# Patient Record
Sex: Female | Born: 1990
Health system: Southern US, Community
[De-identification: ages and names within clinical notes are randomized; demographics above are authoritative.]

## PROBLEM LIST (undated history)

## (undated) DIAGNOSIS — O009 Unspecified ectopic pregnancy without intrauterine pregnancy: Secondary | ICD-10-CM

## (undated) DIAGNOSIS — F32A Depression, unspecified: Secondary | ICD-10-CM

## (undated) DIAGNOSIS — E785 Hyperlipidemia, unspecified: Secondary | ICD-10-CM

## (undated) DIAGNOSIS — F329 Major depressive disorder, single episode, unspecified: Secondary | ICD-10-CM

## (undated) DIAGNOSIS — J45909 Unspecified asthma, uncomplicated: Secondary | ICD-10-CM

## (undated) DIAGNOSIS — R569 Unspecified convulsions: Secondary | ICD-10-CM

## (undated) HISTORY — DX: Hyperlipidemia, unspecified: E78.5

## (undated) HISTORY — PX: APPENDECTOMY: SHX54

## (undated) HISTORY — DX: Unspecified convulsions: R56.9

---

## 2012-01-09 DIAGNOSIS — J45909 Unspecified asthma, uncomplicated: Secondary | ICD-10-CM | POA: Diagnosis not present

## 2012-01-09 DIAGNOSIS — Z23 Encounter for immunization: Secondary | ICD-10-CM | POA: Diagnosis not present

## 2012-01-09 DIAGNOSIS — Z331 Pregnant state, incidental: Secondary | ICD-10-CM | POA: Diagnosis not present

## 2012-01-11 DIAGNOSIS — Z833 Family history of diabetes mellitus: Secondary | ICD-10-CM | POA: Diagnosis not present

## 2012-01-11 DIAGNOSIS — O2390 Unspecified genitourinary tract infection in pregnancy, unspecified trimester: Secondary | ICD-10-CM | POA: Diagnosis not present

## 2012-01-11 DIAGNOSIS — F329 Major depressive disorder, single episode, unspecified: Secondary | ICD-10-CM | POA: Diagnosis not present

## 2012-01-11 DIAGNOSIS — N390 Urinary tract infection, site not specified: Secondary | ICD-10-CM | POA: Diagnosis not present

## 2012-01-11 DIAGNOSIS — O200 Threatened abortion: Secondary | ICD-10-CM | POA: Diagnosis not present

## 2012-01-11 DIAGNOSIS — O209 Hemorrhage in early pregnancy, unspecified: Secondary | ICD-10-CM | POA: Diagnosis not present

## 2012-01-11 DIAGNOSIS — F17200 Nicotine dependence, unspecified, uncomplicated: Secondary | ICD-10-CM | POA: Diagnosis not present

## 2012-01-11 DIAGNOSIS — F411 Generalized anxiety disorder: Secondary | ICD-10-CM | POA: Diagnosis not present

## 2012-01-11 DIAGNOSIS — J45909 Unspecified asthma, uncomplicated: Secondary | ICD-10-CM | POA: Diagnosis not present

## 2012-01-13 DIAGNOSIS — O2690 Pregnancy related conditions, unspecified, unspecified trimester: Secondary | ICD-10-CM | POA: Diagnosis not present

## 2012-01-13 DIAGNOSIS — O200 Threatened abortion: Secondary | ICD-10-CM | POA: Diagnosis not present

## 2012-01-13 DIAGNOSIS — N898 Other specified noninflammatory disorders of vagina: Secondary | ICD-10-CM | POA: Diagnosis not present

## 2012-01-15 DIAGNOSIS — O200 Threatened abortion: Secondary | ICD-10-CM | POA: Diagnosis not present

## 2012-01-15 DIAGNOSIS — O009 Unspecified ectopic pregnancy without intrauterine pregnancy: Secondary | ICD-10-CM | POA: Diagnosis not present

## 2012-01-15 DIAGNOSIS — O00109 Unspecified tubal pregnancy without intrauterine pregnancy: Secondary | ICD-10-CM | POA: Diagnosis not present

## 2012-01-19 DIAGNOSIS — O009 Unspecified ectopic pregnancy without intrauterine pregnancy: Secondary | ICD-10-CM | POA: Diagnosis not present

## 2012-01-23 DIAGNOSIS — O00109 Unspecified tubal pregnancy without intrauterine pregnancy: Secondary | ICD-10-CM | POA: Diagnosis not present

## 2012-01-26 DIAGNOSIS — N9489 Other specified conditions associated with female genital organs and menstrual cycle: Secondary | ICD-10-CM | POA: Diagnosis not present

## 2012-01-26 DIAGNOSIS — O009 Unspecified ectopic pregnancy without intrauterine pregnancy: Secondary | ICD-10-CM | POA: Diagnosis not present

## 2012-01-26 DIAGNOSIS — Z1329 Encounter for screening for other suspected endocrine disorder: Secondary | ICD-10-CM | POA: Diagnosis not present

## 2012-02-02 DIAGNOSIS — O009 Unspecified ectopic pregnancy without intrauterine pregnancy: Secondary | ICD-10-CM | POA: Diagnosis not present

## 2012-02-08 DIAGNOSIS — O00109 Unspecified tubal pregnancy without intrauterine pregnancy: Secondary | ICD-10-CM | POA: Diagnosis not present

## 2012-02-08 DIAGNOSIS — Z113 Encounter for screening for infections with a predominantly sexual mode of transmission: Secondary | ICD-10-CM | POA: Diagnosis not present

## 2012-02-08 DIAGNOSIS — Z01419 Encounter for gynecological examination (general) (routine) without abnormal findings: Secondary | ICD-10-CM | POA: Diagnosis not present

## 2012-02-08 DIAGNOSIS — Z9189 Other specified personal risk factors, not elsewhere classified: Secondary | ICD-10-CM | POA: Diagnosis not present

## 2012-02-16 DIAGNOSIS — O00109 Unspecified tubal pregnancy without intrauterine pregnancy: Secondary | ICD-10-CM | POA: Diagnosis not present

## 2012-02-29 DIAGNOSIS — H43399 Other vitreous opacities, unspecified eye: Secondary | ICD-10-CM | POA: Diagnosis not present

## 2012-06-19 DIAGNOSIS — N912 Amenorrhea, unspecified: Secondary | ICD-10-CM | POA: Diagnosis not present

## 2012-06-24 DIAGNOSIS — N912 Amenorrhea, unspecified: Secondary | ICD-10-CM | POA: Diagnosis not present

## 2012-06-25 DIAGNOSIS — O039 Complete or unspecified spontaneous abortion without complication: Secondary | ICD-10-CM | POA: Diagnosis not present

## 2012-06-25 DIAGNOSIS — N898 Other specified noninflammatory disorders of vagina: Secondary | ICD-10-CM | POA: Diagnosis not present

## 2012-06-25 DIAGNOSIS — J45909 Unspecified asthma, uncomplicated: Secondary | ICD-10-CM | POA: Diagnosis not present

## 2012-06-25 DIAGNOSIS — F17200 Nicotine dependence, unspecified, uncomplicated: Secondary | ICD-10-CM | POA: Diagnosis not present

## 2012-07-02 DIAGNOSIS — O200 Threatened abortion: Secondary | ICD-10-CM | POA: Diagnosis not present

## 2013-02-24 DIAGNOSIS — J45902 Unspecified asthma with status asthmaticus: Secondary | ICD-10-CM | POA: Diagnosis not present

## 2014-01-08 ENCOUNTER — Emergency Department (HOSPITAL_COMMUNITY): Payer: Medicare Other

## 2014-01-08 ENCOUNTER — Emergency Department (HOSPITAL_COMMUNITY)
Admission: EM | Admit: 2014-01-08 | Discharge: 2014-01-08 | Disposition: A | Discharge status: Home / Self Care | Payer: Medicare Other | Attending: Emergency Medicine | Admitting: Emergency Medicine

## 2014-01-08 ENCOUNTER — Encounter (HOSPITAL_COMMUNITY): Payer: Self-pay | Admitting: Emergency Medicine

## 2014-01-08 DIAGNOSIS — F3289 Other specified depressive episodes: Secondary | ICD-10-CM | POA: Insufficient documentation

## 2014-01-08 DIAGNOSIS — F17200 Nicotine dependence, unspecified, uncomplicated: Secondary | ICD-10-CM | POA: Diagnosis not present

## 2014-01-08 DIAGNOSIS — IMO0002 Reserved for concepts with insufficient information to code with codable children: Secondary | ICD-10-CM | POA: Diagnosis not present

## 2014-01-08 DIAGNOSIS — J45909 Unspecified asthma, uncomplicated: Secondary | ICD-10-CM | POA: Insufficient documentation

## 2014-01-08 DIAGNOSIS — Z79899 Other long term (current) drug therapy: Secondary | ICD-10-CM | POA: Insufficient documentation

## 2014-01-08 DIAGNOSIS — Z792 Long term (current) use of antibiotics: Secondary | ICD-10-CM | POA: Diagnosis not present

## 2014-01-08 DIAGNOSIS — F329 Major depressive disorder, single episode, unspecified: Secondary | ICD-10-CM | POA: Insufficient documentation

## 2014-01-08 DIAGNOSIS — R509 Fever, unspecified: Secondary | ICD-10-CM | POA: Insufficient documentation

## 2014-01-08 DIAGNOSIS — Z3202 Encounter for pregnancy test, result negative: Secondary | ICD-10-CM | POA: Insufficient documentation

## 2014-01-08 DIAGNOSIS — N390 Urinary tract infection, site not specified: Secondary | ICD-10-CM | POA: Diagnosis not present

## 2014-01-08 DIAGNOSIS — R109 Unspecified abdominal pain: Secondary | ICD-10-CM | POA: Diagnosis not present

## 2014-01-08 HISTORY — DX: Unspecified asthma, uncomplicated: J45.909

## 2014-01-08 HISTORY — DX: Major depressive disorder, single episode, unspecified: F32.9

## 2014-01-08 HISTORY — DX: Unspecified ectopic pregnancy without intrauterine pregnancy: O00.90

## 2014-01-08 HISTORY — DX: Depression, unspecified: F32.A

## 2014-01-08 LAB — URINALYSIS, ROUTINE W REFLEX MICROSCOPIC
Bilirubin Urine: NEGATIVE
Glucose, UA: NEGATIVE mg/dL
KETONES UR: NEGATIVE mg/dL
NITRITE: POSITIVE — AB
PROTEIN: NEGATIVE mg/dL
Specific Gravity, Urine: 1.015 (ref 1.005–1.030)
Urobilinogen, UA: 0.2 mg/dL (ref 0.0–1.0)
pH: 7 (ref 5.0–8.0)

## 2014-01-08 LAB — URINE MICROSCOPIC-ADD ON

## 2014-01-08 LAB — PREGNANCY, URINE: Preg Test, Ur: NEGATIVE

## 2014-01-08 MED ORDER — CEPHALEXIN 500 MG PO CAPS: 500.0000 mg | ORAL_CAPSULE | Freq: Four times a day (QID) | ORAL | Status: DC

## 2014-01-08 NOTE — ED Provider Notes (Signed)
CSN: [id number]     Arrival date & time 01/08/14  [id number] History   First MD Initiated Contact with Patient 01/08/14 0940     Chief Complaint  Patient presents with  . Abdominal Pain  . Flank Pain    HPI Pt was seen at 0945. Per pt, c/o sudden onset and persistence of constant left sided low back "pain" that began 3 to 4 days ago.  Pt describes the pain as "sharp," "shooting," and occasionally radiating into the left side of her abd.  Has been associated with home temps to "100." Denies any other symptoms. Denies vaginal bleeding/discharge, no dysuria/hematuria, no abd pain, no N/V/D, no black or blood in stools, no CP/SOB.    Past Medical History  Diagnosis Date  . Asthma   . Depression   . Ectopic pregnancy    Past Surgical History  Procedure Laterality Date  . Appendectomy      History  Substance Use Topics  . Smoking status: Current Every Day Smoker    Types: Cigarettes  . Smokeless tobacco: Not on file  . Alcohol Use: No    Review of Systems ROS: Statement: All systems negative except as marked or noted in the HPI; Constitutional: +chills. ; ; Eyes: Negative for eye pain, redness and discharge. ; ; ENMT: Negative for ear pain, hoarseness, nasal congestion, sinus pressure and sore throat. ; ; Cardiovascular: Negative for chest pain, palpitations, diaphoresis, dyspnea and peripheral edema. ; ; Respiratory: Negative for cough, wheezing and stridor. ; ; Gastrointestinal: Negative for nausea, vomiting, diarrhea, abdominal pain, blood in stool, hematemesis, jaundice and rectal bleeding. . ; ; Genitourinary: Negative for dysuria, flank pain and hematuria. ; ; GYN:  No vaginal bleeding, no vaginal discharge, no vulvar pain.;; Musculoskeletal: +low back pain. Negative for neck pain. Negative for swelling and trauma.; ; Skin: Negative for pruritus, rash, abrasions, blisters, bruising and skin lesion.; ; Neuro: Negative for headache, lightheadedness and neck stiffness. Negative for weakness,  altered level of consciousness , altered mental status, extremity weakness, paresthesias, involuntary movement, seizure and syncope.       Allergies  Codeine  Home Medications   Current Outpatient Rx  Name  Route  Sig  Dispense  Refill  . albuterol (PROVENTIL HFA;VENTOLIN HFA) 108 (90 BASE) MCG/ACT inhaler   Inhalation   Inhale 2 puffs into the lungs every 6 (six) hours as needed for wheezing or shortness of breath.         Marland Kitchen. buPROPion (WELLBUTRIN SR) 150 MG 12 hr tablet   Oral   Take 150 mg by mouth 2 (two) times daily.         Marland Kitchen. escitalopram (LEXAPRO) 20 MG tablet   Oral   Take 20 mg by mouth daily.         . Fluticasone-Salmeterol (ADVAIR) 250-50 MCG/DOSE AEPB   Inhalation   Inhale 1 puff into the lungs 2 (two) times daily.         Marland Kitchen. OVER THE COUNTER MEDICATION   Both Eyes   Place 2 drops into both eyes daily as needed (burning).         . cephALEXin (KEFLEX) 500 MG capsule   Oral   Take 1 capsule (500 mg total) by mouth 4 (four) times daily.   40 capsule   0    BP 108/71  Pulse 106  Temp(Src) 98.4 F (36.9 C) (Oral)  Resp 16  Ht 5' (1.524 m)  Wt 70 lb (31.752 kg)  BMI 13.67 kg/m2  SpO2 100%  LMP 01/06/2014 Physical Exam 0950: Physical examination:  Nursing notes reviewed; Vital signs and O2 SAT reviewed;  Constitutional: Well developed, Well nourished, Well hydrated, In no acute distress; Head:  Normocephalic, atraumatic; Eyes: EOMI, PERRL, No scleral icterus; ENMT: Mouth and pharynx normal, Mucous membranes moist; Neck: Supple, Full range of motion, No lymphadenopathy; Cardiovascular: Regular rate and rhythm, No murmur, rub, or gallop; Respiratory: Breath sounds clear & equal bilaterally, No rales, rhonchi, wheezes.  Speaking full sentences with ease, Normal respiratory effort/excursion; Chest: Nontender, Movement normal; Abdomen: Soft, Nontender, Nondistended, Normal bowel sounds; Genitourinary: No CVA tenderness; Spine:  No midline CS, TS, LS  tenderness. +mild TTP left lumbar paraspinal muscles;;  Extremities: Pulses normal, No tenderness, No edema, No calf edema or asymmetry.; Neuro: AA&Ox3, Major CN grossly intact.  Speech clear. No gross focal motor or sensory deficits in extremities. Climbs on and off stretcher easily by herself. Gait steady.; Skin: Color normal, Warm, Dry.   ED Course  Procedures   EKG Interpretation   None       MDM  MDM Reviewed: previous chart, nursing note and vitals Reviewed previous: labs Interpretation: labs and CT scan     Results for orders placed during the hospital encounter of 01/08/14  URINALYSIS, ROUTINE W REFLEX MICROSCOPIC      Result Value Range   Color, Urine YELLOW  YELLOW   APPearance CLEAR  CLEAR   Specific Gravity, Urine 1.015  1.005 - 1.030   pH 7.0  5.0 - 8.0   Glucose, UA NEGATIVE  NEGATIVE mg/dL   Hgb urine dipstick SMALL (*) NEGATIVE   Bilirubin Urine NEGATIVE  NEGATIVE   Ketones, ur NEGATIVE  NEGATIVE mg/dL   Protein, ur NEGATIVE  NEGATIVE mg/dL   Urobilinogen, UA 0.2  0.0 - 1.0 mg/dL   Nitrite POSITIVE (*) NEGATIVE   Leukocytes, UA SMALL (*) NEGATIVE  PREGNANCY, URINE      Result Value Range   Preg Test, Ur NEGATIVE  NEGATIVE  URINE MICROSCOPIC-ADD ON      Result Value Range   Squamous Epithelial / LPF MANY (*) RARE   WBC, UA TOO NUMEROUS TO COUNT  <3 WBC/hpf   RBC / HPF TOO NUMEROUS TO COUNT  <3 RBC/hpf   Bacteria, UA MANY (*) RARE   Ct Abdomen Pelvis Wo Contrast 01/08/2014   CLINICAL DATA:  Abdominal pain.  Flank pain.  Left-sided pain.  EXAM: CT ABDOMEN AND PELVIS WITHOUT CONTRAST  TECHNIQUE: Multidetector CT imaging of the abdomen and pelvis was performed following the standard protocol without intravenous contrast.  COMPARISON:  None.  FINDINGS: Minimal atelectasis is present at the left base. The lungs are otherwise clear. The heart size is normal. No significant pleural or pericardial effusion is present. The liver and spleen are within normal limits.   The stomach, duodenum, and pancreas are within normal limits. The common bile duct and gallbladder are normal. The adrenal glands are normal bilaterally. The kidneys and ureters are within normal limits. The urinary bladder is unremarkable.  The rectus sigmoid colon is within normal limits. The remainder the colon is unremarkable. The appendix is not discretely visualized and may be surgically absent. There are no secondary signs inflammation. The small bowel is within normal limits. No significant adenopathy or free fluid is present.  The bone windows are unremarkable.  IMPRESSION: No acute abnormality or focal lesion to explain the patient's symptoms.   Electronically Signed   By: Gennette Pac M.D.   On: 01/08/2014 12:06  1215:  Has tol PO well while in the ED without N/V. Abd remains benign, VSS. Pt wants to go home now. +UTI, UC pending. Will tx with keflex. Dx and testing d/w pt.  Questions answered.  Verb understanding, agreeable to d/c home with outpt f/u.   Laray Anger, DO 01/11/14 1150

## 2014-01-08 NOTE — ED Notes (Signed)
Pt. Given ginger ale to drink. Awaiting discharge.

## 2014-01-08 NOTE — ED Notes (Signed)
Pt. Given detailed discharge instructions. 1 script given, verbalized understanding of all.

## 2014-01-08 NOTE — ED Notes (Addendum)
Pt states constant, sharp, pain to left lower back. Intermittent stabbing pain to LLQ of abdomen. Denies N/V/D. Also states headache and temp off 100.0. NAD at this time.

## 2014-01-08 NOTE — Discharge Instructions (Signed)
°Emergency Department Resource Guide °1) Find a Doctor and Pay Out of Pocket °Although you won't have to find out who is covered by your insurance plan, it is a good idea to ask around and get recommendations. You will then need to call the office and see if the doctor you have chosen will accept you as a new patient and what types of options they offer for patients who are self-pay. Some doctors offer discounts or will set up payment plans for their patients who do not have insurance, but you will need to ask so you aren't surprised when you get to your appointment. ° °2) Contact Your Local Health Department °Not all health departments have doctors that can see patients for sick visits, but many do, so it is worth a call to see if yours does. If you don't know where your local health department is, you can check in your phone book. The CDC also has a tool to help you locate your state's health department, and many state websites also have listings of all of their local health departments. ° °3) Find a Walk-in Clinic °If your illness is not likely to be very severe or complicated, you may want to try a walk in clinic. These are popping up all over the country in pharmacies, drugstores, and shopping centers. They're usually staffed by nurse practitioners or physician assistants that have been trained to treat common illnesses and complaints. They're usually fairly quick and inexpensive. However, if you have serious medical issues or chronic medical problems, these are probably not your best option. ° °No Primary Care Doctor: °- Call Health Connect at  832-8000 - they can help you locate a primary care doctor that  accepts your insurance, provides certain services, etc. °- Physician Referral Service- 1-800-533-3463 ° °Chronic Pain Problems: °Organization         Address  Phone   Notes  °[facility]  ([phone]) [phone] Patients need to be referred by their primary care doctor.  ° °Medication  Assistance: °Organization         Address  Phone   Notes  °[place] [facility] [address] °[place], [place] [address] (336) 641-8030 --Must be a resident of [place] °-- Must have NO insurance coverage whatsoever (no Medicaid/ Medicare, etc.) °-- The pt. MUST have a primary care doctor that directs their care regularly and follows them in the community °  °[facility]  (866) 331-1348   °[employer]  (888) 892-1162   ° °Agencies that provide inexpensive medical care: °Organization         Address  Phone   Notes  °[facility]  (336) 832-8035   °[facility]    (336) 832-7272   °[facility] [address] °[place], [place] [address] (336) 832-4777   °[facility] [address], °[place] (336) 271-4999   °[facility]    (336) 373-0678   °[facility]    (336) 272-1050   °[facility] ° [address], [place] Phone:  (336) 832-4444, Fax:  (336) 832-4440 Hours of Operation:  9 am - 6 pm, M-F.  Also accepts Medicaid/Medicare and self-pay.  °[facility] ° [address], [place] Phone: (336) [phone], Fax: (336) [phone]. Hours of Operation:  8:30 am - 5:30 pm, M-F.  Also accepts Medicaid and self-pay.  °[facility] [place] [address], [place] Phone: (336) 878-6027   °[facility] [address], [place], [place] (336)723-1848, Ext. 123 Mondays & Thursdays: 7-9 AM.  First 15 patients are seen on a first come, first serve basis. °  ° °Medicaid-accepting [place] Providers: ° °Organization         Address  Phone   Notes  °[facility] [address], [place] (336) 641-2100 Also accepts self-pay patients.  °[facility] [address], [place] ° (336) 856-9996   °[facility] [address], [place]  (336) 288-8857   °[facility] [address] [place] [address], [place] (336) 299-7000   °Veita Bland [address], [place]  ° (336) 373-1557 Only accepts [place] Access Medicaid patients after they have their name applied to their card.  ° °Self-Pay (no insurance) in [place]: ° °Organization         Address  Phone   Notes  °Sickle Cell Patients, [facility] [address], [place] (336) 832-1970   °[facility] [address], [place] (336) 832-4400   °[facility] ° [address], [place] (336) 992-4800   °[facility]/Dr. Osei-Bonsu ° [address] [place] [address], [place] or [address], [place] (336) 841-8500 Phone number for both [place] and [place] locations is the same.  °[facility] [address], [place] (336) 299-0000   °[facility] [address] [place] [address], [place] or [address] (336) 852-7530 °(336) 878-2260   °[facility] [address], [place] (336) 350-1642, phone; (336) 294-5005, fax Sees patients 1st and 3rd Saturday of every month.  Must not qualify for public or private insurance (i.e. Medicaid, Medicare, [place] Health Choice, Veterans' Benefits) • Household income should be no more than 200% of the poverty level •The clinic cannot treat you if you are pregnant or think you are pregnant • Sexually transmitted diseases are not treated at the clinic.  ° ° °Dental Care: °Organization         Address  Phone  Notes  °[place] [facility] [address], [place] (336) 641-6152 Accepts children up to age 21 who are enrolled in Medicaid or [place] Health Choice; pregnant women with a Medicaid card; and children who have applied for Medicaid or [place] Health Choice, but were declined, whose parents can pay a reduced fee at time of service.  °[place]  [facility] [place]  [address], [place] (336) 641-7733 Accepts children up to age 21 who are enrolled in Medicaid or [place] Health Choice; pregnant women with a Medicaid card; and children who have applied for Medicaid or [place] Health Choice, but were declined, whose parents can pay a reduced fee at time of service.  °[facility] ° [address], [place] (336) 641-4533 Patients are seen by appointment only. Walk-ins are not accepted. [facility] will see patients 18 years of age and older. °Monday - Tuesday (8am-5pm) °Most Wednesdays (8:30-5pm) °$30 per visit, cash only  °[facility] ° [address], [place] (336) 641-4533 Patients are seen by appointment only. Walk-ins are not accepted. [facility] will see patients 18 years of age and older. °One   Wednesday Evening (Monthly: Volunteer Based).  $30 per visit, cash only  °[facility]  (919) 537-3737 for adults; Children under age 4, call Graduate Pediatric Dentistry at ([phone]) [phone]. Children aged 4-14, please call ([phone]) [phone] to request a pediatric application. ° Dental services are provided in all areas of dental care including fillings, crowns and bridges, complete and partial dentures, implants, gum treatment, root canals, and extractions. Preventive care is also provided. Treatment is provided to both adults and children. °Patients are selected via a lottery and there is often a waiting list. °  °[facility] [address], °[place] ° (336) 763-8833 [email or web address] °  °[facility] [address], [place], [place] (336)723-1848, Ext. 123 Second and Fourth Thursday of each month, opens at 6:30 AM; Clinic ends at 9 AM.  Patients are seen on a first-come first-served basis, and a limited number are seen during each clinic.  ° °[facility] ° [address], [place], [place] (336) 723-7904    Eligibility Requirements °You must have lived in [place], [place], or [place] counties for at least the last three months. °  You cannot be eligible for state or federal sponsored healthcare insurance, including Veterans Administration, Medicaid, or Medicare. °  You generally cannot be eligible for healthcare insurance through your employer.  °  How to apply: °Eligibility screenings are held every Tuesday and Wednesday afternoon from 1:00 pm until 4:00 pm. You do not need an appointment for the interview!  °[facility] [address], [place], [place] 336-631-2330   °[facility]  336-342-8273   °[facility]  336-703-3100   °[facility]  336-570-6415   ° °Behavioral Health Resources in the Community: °Intensive Outpatient Programs °Organization         Address  Phone  Notes  °[facility] [address], [place], [place] 336-878-6098   °[facility] [address], [place], [place] 336-832-9800   °[facility] [address], [place], [place] ° 336-882-2125   °[facility] [address],  °[place], [place] 1-800-853-5163 or 336-641-4981   °Substance Abuse Resources °Organization         Address  Phone  Notes  °[facility]  336-882-2125   °[facility]  336-784-9470   °[facility]  336-285-9073   °[facility]  336-845-3988   °Residential & Outpatient Substance Abuse Program  1-800-659-3381   °Psychological Services °Organization         Address  Phone  Notes  °[facility]  336- 832-9600   °[facility]  336- 378-7881   °[facility] [address], [place] 1-800-853-5163 or 336-641-4981   ° °Mobile Crisis Teams °Organization         Address  Phone  Notes  °[facility], Mobile Crisis Care Unit  1-877-626-1772   °Assertive °[facility] ° [address]  [place], [place] 336-834-9664   °Sharon DeEsch [address] °[place] 336-554-5454   ° °Self-Help/Support Groups °Organization         Address  Phone             Notes  °[facility] of  - variety of support groups  336- 373-1402 Call for more information  °Narcotics Anonymous (NA), [facility] [address], °[place]  2 meetings at this location  ° °  Residential Treatment Programs Organization         Address  Phone  Notes  [facility] [address],    [place]  1-610-960-45401-(250)349-4261   [facility]  [address], [place] [phone], [place], [place] [phone]   [facility] [address], [place] [phone] Admissions: 8am-3pm M-F  [facility] [address]    [place], [place] 295-284-1324([phone]   [facility] [address], [place], [place] [phone]   [facility] [address]  [place], [place] [phone]) [phone]   [facility] Intensive Outpatient [address], [place], [place] [phone]   [facility] [address]  [place], [place] [phone] or [phone]   [facility] ([facility]) [address] [place], [place] [phone] Accepts Medicaid  [facility] [address]  [place] [phone]) [phone] Substance Abuse/Addiction Treatment   [facility] Resources Organization         Address  Phone  Notes  [facility]  506-798-0214(888) 425-240-6435   Angie FavaJulie Brannon, PhD [address], [address] [place], [place]   [phone](336) [phone] or [phone](336) ([phone]   [facility]   [address] [place], [place] [phone]) [phone]   [facility] [address], [place], [place] [phone](336) [phone] Insurance/Medicaid/sponsorship through [facility]  [facility] [address]                                    [place], [place] ([phone](336) [phone] Therapy/tele-psych/case    [facility] [address]   [place], [place] [phone]) [phone]    Dr. Lolly MustacheArfeen  [phone]) [phone]   [facility] [place]  [employer] [facility] 1) [address], [place] 2) [address], [place] 3)  [address], [place] [phone](336) ([phone](336) [phone]  [phone]   [facility] Child Abuse Hotline 778-139-4579(336) 660-282-9954 or 5481269925(336) [phone] (After Hours)       Take the prescription as directed. Take over the counter tylenol and ibuprofen, as directed on packaging, as needed for discomfort. Apply moist heat or ice to the area(s) of discomfort, for 15 minutes at a time, several times per day for the next few days.  Do not fall asleep on a heating or ice pack.  Call your regular medical doctor today to schedule a follow up appointment within the next 2 days.  Return to the Emergency Department immediately if worsening.

## 2014-01-10 LAB — URINE CULTURE

## 2014-01-11 ENCOUNTER — Telehealth (HOSPITAL_COMMUNITY): Payer: Self-pay | Admitting: Emergency Medicine

## 2014-01-11 NOTE — ED Notes (Signed)
Post ED Visit - Positive Culture Follow-up  Culture report reviewed by antimicrobial stewardship pharmacist: []  Wes Dulaney, Pharm.D., BCPS [x]  Celedonio MiyamotoJeremy Frens, Pharm.D., BCPS []  Georgina PillionElizabeth Martin, Pharm.D., BCPS []  NutriosoMinh Pham, [address]Ph[address].D., BCPS, AAHIVP []  Estella HuskMichelle Turner, Pharm.D., BCPS, AAHIVP  Positive urine culture Treated with Keflex, organism sensitive to the same and no further patient follow-up is required at this time.  Zeb ComfortHolland, Dynver [place] 01/11/2014, 10:39 AM

## 2014-03-24 DIAGNOSIS — F320 Major depressive disorder, single episode, mild: Secondary | ICD-10-CM | POA: Diagnosis not present

## 2014-03-24 DIAGNOSIS — R300 Dysuria: Secondary | ICD-10-CM | POA: Diagnosis not present

## 2014-03-24 DIAGNOSIS — J45902 Unspecified asthma with status asthmaticus: Secondary | ICD-10-CM | POA: Diagnosis not present

## 2014-03-24 DIAGNOSIS — F411 Generalized anxiety disorder: Secondary | ICD-10-CM | POA: Diagnosis not present

## 2014-03-24 DIAGNOSIS — E669 Obesity, unspecified: Secondary | ICD-10-CM | POA: Diagnosis not present

## 2014-03-24 DIAGNOSIS — F17200 Nicotine dependence, unspecified, uncomplicated: Secondary | ICD-10-CM | POA: Diagnosis not present

## 2014-06-25 DIAGNOSIS — F411 Generalized anxiety disorder: Secondary | ICD-10-CM | POA: Diagnosis not present

## 2014-06-25 DIAGNOSIS — J45902 Unspecified asthma with status asthmaticus: Secondary | ICD-10-CM | POA: Diagnosis not present

## 2014-06-25 DIAGNOSIS — F320 Major depressive disorder, single episode, mild: Secondary | ICD-10-CM | POA: Diagnosis not present

## 2014-06-25 DIAGNOSIS — E669 Obesity, unspecified: Secondary | ICD-10-CM | POA: Diagnosis not present

## 2014-06-25 DIAGNOSIS — F17200 Nicotine dependence, unspecified, uncomplicated: Secondary | ICD-10-CM | POA: Diagnosis not present

## 2014-08-27 ENCOUNTER — Ambulatory Visit (HOSPITAL_COMMUNITY): Payer: Self-pay | Admitting: Psychiatry

## 2014-09-21 DIAGNOSIS — J4542 Moderate persistent asthma with status asthmaticus: Secondary | ICD-10-CM | POA: Diagnosis not present

## 2014-09-21 DIAGNOSIS — F411 Generalized anxiety disorder: Secondary | ICD-10-CM | POA: Diagnosis not present

## 2014-09-21 DIAGNOSIS — Z720 Tobacco use: Secondary | ICD-10-CM | POA: Diagnosis not present

## 2014-09-21 DIAGNOSIS — Z23 Encounter for immunization: Secondary | ICD-10-CM | POA: Diagnosis not present

## 2014-09-21 DIAGNOSIS — F320 Major depressive disorder, single episode, mild: Secondary | ICD-10-CM | POA: Diagnosis not present

## 2014-12-21 DIAGNOSIS — Z3202 Encounter for pregnancy test, result negative: Secondary | ICD-10-CM | POA: Diagnosis not present

## 2014-12-21 DIAGNOSIS — F319 Bipolar disorder, unspecified: Secondary | ICD-10-CM | POA: Diagnosis not present

## 2014-12-21 DIAGNOSIS — F320 Major depressive disorder, single episode, mild: Secondary | ICD-10-CM | POA: Diagnosis not present

## 2014-12-21 DIAGNOSIS — J4542 Moderate persistent asthma with status asthmaticus: Secondary | ICD-10-CM | POA: Diagnosis not present

## 2014-12-21 DIAGNOSIS — Z1389 Encounter for screening for other disorder: Secondary | ICD-10-CM | POA: Diagnosis not present

## 2015-01-18 DIAGNOSIS — F411 Generalized anxiety disorder: Secondary | ICD-10-CM | POA: Diagnosis not present

## 2015-01-18 DIAGNOSIS — F319 Bipolar disorder, unspecified: Secondary | ICD-10-CM | POA: Diagnosis not present

## 2015-01-18 DIAGNOSIS — Z7251 High risk heterosexual behavior: Secondary | ICD-10-CM | POA: Diagnosis not present

## 2015-01-18 DIAGNOSIS — Z7253 High risk bisexual behavior: Secondary | ICD-10-CM | POA: Diagnosis not present

## 2015-01-18 DIAGNOSIS — F320 Major depressive disorder, single episode, mild: Secondary | ICD-10-CM | POA: Diagnosis not present

## 2015-04-19 DIAGNOSIS — F411 Generalized anxiety disorder: Secondary | ICD-10-CM | POA: Diagnosis not present

## 2015-04-19 DIAGNOSIS — F320 Major depressive disorder, single episode, mild: Secondary | ICD-10-CM | POA: Diagnosis not present

## 2015-04-19 DIAGNOSIS — J019 Acute sinusitis, unspecified: Secondary | ICD-10-CM | POA: Diagnosis not present

## 2015-04-19 DIAGNOSIS — Z30011 Encounter for initial prescription of contraceptive pills: Secondary | ICD-10-CM | POA: Diagnosis not present

## 2015-10-21 DIAGNOSIS — Z3202 Encounter for pregnancy test, result negative: Secondary | ICD-10-CM | POA: Diagnosis not present

## 2015-10-21 DIAGNOSIS — F411 Generalized anxiety disorder: Secondary | ICD-10-CM | POA: Diagnosis not present

## 2015-10-21 DIAGNOSIS — F319 Bipolar disorder, unspecified: Secondary | ICD-10-CM | POA: Diagnosis not present

## 2015-10-21 DIAGNOSIS — J4520 Mild intermittent asthma, uncomplicated: Secondary | ICD-10-CM | POA: Diagnosis not present

## 2015-10-21 DIAGNOSIS — F331 Major depressive disorder, recurrent, moderate: Secondary | ICD-10-CM | POA: Diagnosis not present

## 2016-02-15 DIAGNOSIS — H40033 Anatomical narrow angle, bilateral: Secondary | ICD-10-CM | POA: Diagnosis not present

## 2016-02-15 DIAGNOSIS — G44219 Episodic tension-type headache, not intractable: Secondary | ICD-10-CM | POA: Diagnosis not present

## 2016-04-19 DIAGNOSIS — J4542 Moderate persistent asthma with status asthmaticus: Secondary | ICD-10-CM | POA: Diagnosis not present

## 2016-04-19 DIAGNOSIS — E559 Vitamin D deficiency, unspecified: Secondary | ICD-10-CM | POA: Diagnosis not present

## 2016-04-19 DIAGNOSIS — F331 Major depressive disorder, recurrent, moderate: Secondary | ICD-10-CM | POA: Diagnosis not present

## 2016-04-19 DIAGNOSIS — Z1389 Encounter for screening for other disorder: Secondary | ICD-10-CM | POA: Diagnosis not present

## 2016-04-19 DIAGNOSIS — F411 Generalized anxiety disorder: Secondary | ICD-10-CM | POA: Diagnosis not present

## 2016-04-19 DIAGNOSIS — Z720 Tobacco use: Secondary | ICD-10-CM | POA: Diagnosis not present

## 2016-04-19 DIAGNOSIS — K5900 Constipation, unspecified: Secondary | ICD-10-CM | POA: Diagnosis not present

## 2016-04-19 DIAGNOSIS — R350 Frequency of micturition: Secondary | ICD-10-CM | POA: Diagnosis not present

## 2016-04-19 DIAGNOSIS — R5383 Other fatigue: Secondary | ICD-10-CM | POA: Diagnosis not present

## 2016-04-19 DIAGNOSIS — F319 Bipolar disorder, unspecified: Secondary | ICD-10-CM | POA: Diagnosis not present

## 2016-05-02 ENCOUNTER — Telehealth (HOSPITAL_COMMUNITY): Payer: Self-pay | Admitting: *Deleted

## 2016-06-19 ENCOUNTER — Telehealth (HOSPITAL_COMMUNITY): Payer: Self-pay | Admitting: *Deleted

## 2016-06-19 ENCOUNTER — Ambulatory Visit (HOSPITAL_COMMUNITY): Payer: Self-pay | Admitting: Psychiatry

## 2016-06-19 NOTE — Telephone Encounter (Signed)
phone call, the wireless customer is not available. 

## 2016-06-19 NOTE — Telephone Encounter (Signed)
phone call, the wireless customer is not available.

## 2016-06-19 NOTE — Telephone Encounter (Signed)
second phone call, the wireless customer is not available.

## 2016-06-19 NOTE — Telephone Encounter (Signed)
Called pt to resch her appt for July 3 due to provider being out of office and pt number is disconnected. Unable to leave message.

## 2016-08-30 ENCOUNTER — Ambulatory Visit (HOSPITAL_COMMUNITY): Payer: Self-pay | Admitting: Psychiatry

## 2016-10-10 DIAGNOSIS — F411 Generalized anxiety disorder: Secondary | ICD-10-CM | POA: Diagnosis not present

## 2016-10-10 DIAGNOSIS — Z6831 Body mass index (BMI) 31.0-31.9, adult: Secondary | ICD-10-CM | POA: Diagnosis not present

## 2016-10-10 DIAGNOSIS — F319 Bipolar disorder, unspecified: Secondary | ICD-10-CM | POA: Diagnosis not present

## 2016-10-10 DIAGNOSIS — Z23 Encounter for immunization: Secondary | ICD-10-CM | POA: Diagnosis not present

## 2016-10-10 DIAGNOSIS — F331 Major depressive disorder, recurrent, moderate: Secondary | ICD-10-CM | POA: Diagnosis not present

## 2016-10-10 DIAGNOSIS — Z720 Tobacco use: Secondary | ICD-10-CM | POA: Diagnosis not present

## 2017-01-12 DIAGNOSIS — F331 Major depressive disorder, recurrent, moderate: Secondary | ICD-10-CM | POA: Diagnosis not present

## 2017-01-12 DIAGNOSIS — Z7251 High risk heterosexual behavior: Secondary | ICD-10-CM | POA: Diagnosis not present

## 2017-01-12 DIAGNOSIS — Z13220 Encounter for screening for lipoid disorders: Secondary | ICD-10-CM | POA: Diagnosis not present

## 2017-01-12 DIAGNOSIS — F411 Generalized anxiety disorder: Secondary | ICD-10-CM | POA: Diagnosis not present

## 2017-01-12 DIAGNOSIS — Z720 Tobacco use: Secondary | ICD-10-CM | POA: Diagnosis not present

## 2017-01-12 DIAGNOSIS — F319 Bipolar disorder, unspecified: Secondary | ICD-10-CM | POA: Diagnosis not present

## 2017-01-16 DIAGNOSIS — Z0000 Encounter for general adult medical examination without abnormal findings: Secondary | ICD-10-CM | POA: Diagnosis not present

## 2017-01-16 DIAGNOSIS — Z1389 Encounter for screening for other disorder: Secondary | ICD-10-CM | POA: Diagnosis not present

## 2017-01-16 DIAGNOSIS — Z01419 Encounter for gynecological examination (general) (routine) without abnormal findings: Secondary | ICD-10-CM | POA: Diagnosis not present

## 2017-06-09 DIAGNOSIS — R0602 Shortness of breath: Secondary | ICD-10-CM | POA: Diagnosis not present

## 2017-06-09 DIAGNOSIS — J45901 Unspecified asthma with (acute) exacerbation: Secondary | ICD-10-CM | POA: Diagnosis not present

## 2017-06-09 DIAGNOSIS — Z720 Tobacco use: Secondary | ICD-10-CM | POA: Diagnosis not present

## 2017-06-09 DIAGNOSIS — J189 Pneumonia, unspecified organism: Secondary | ICD-10-CM | POA: Diagnosis not present

## 2017-06-09 DIAGNOSIS — Z79899 Other long term (current) drug therapy: Secondary | ICD-10-CM | POA: Diagnosis not present

## 2017-06-14 DIAGNOSIS — R05 Cough: Secondary | ICD-10-CM | POA: Diagnosis not present

## 2017-06-14 DIAGNOSIS — J189 Pneumonia, unspecified organism: Secondary | ICD-10-CM | POA: Diagnosis not present

## 2018-01-23 DIAGNOSIS — J309 Allergic rhinitis, unspecified: Secondary | ICD-10-CM | POA: Diagnosis not present

## 2018-01-23 DIAGNOSIS — Z1389 Encounter for screening for other disorder: Secondary | ICD-10-CM | POA: Diagnosis not present

## 2018-02-15 DIAGNOSIS — R197 Diarrhea, unspecified: Secondary | ICD-10-CM | POA: Diagnosis not present

## 2018-02-25 DIAGNOSIS — S01311A Laceration without foreign body of right ear, initial encounter: Secondary | ICD-10-CM | POA: Diagnosis not present

## 2018-02-25 DIAGNOSIS — R5383 Other fatigue: Secondary | ICD-10-CM | POA: Diagnosis not present

## 2018-02-25 DIAGNOSIS — Z0000 Encounter for general adult medical examination without abnormal findings: Secondary | ICD-10-CM | POA: Diagnosis not present

## 2018-02-28 DIAGNOSIS — Z0000 Encounter for general adult medical examination without abnormal findings: Secondary | ICD-10-CM | POA: Diagnosis not present

## 2018-06-04 DIAGNOSIS — E782 Mixed hyperlipidemia: Secondary | ICD-10-CM | POA: Diagnosis not present

## 2018-06-06 DIAGNOSIS — E782 Mixed hyperlipidemia: Secondary | ICD-10-CM | POA: Diagnosis not present

## 2018-06-25 DIAGNOSIS — R350 Frequency of micturition: Secondary | ICD-10-CM | POA: Diagnosis not present

## 2018-06-25 DIAGNOSIS — K5900 Constipation, unspecified: Secondary | ICD-10-CM | POA: Diagnosis not present

## 2018-06-25 DIAGNOSIS — R1030 Lower abdominal pain, unspecified: Secondary | ICD-10-CM | POA: Diagnosis not present

## 2018-06-25 DIAGNOSIS — R100 Acute abdomen: Secondary | ICD-10-CM | POA: Diagnosis not present

## 2018-06-25 DIAGNOSIS — Z7689 Persons encountering health services in other specified circumstances: Secondary | ICD-10-CM | POA: Diagnosis not present

## 2018-09-03 DIAGNOSIS — R197 Diarrhea, unspecified: Secondary | ICD-10-CM | POA: Diagnosis not present

## 2018-09-03 DIAGNOSIS — E782 Mixed hyperlipidemia: Secondary | ICD-10-CM | POA: Diagnosis not present

## 2018-12-05 DIAGNOSIS — E782 Mixed hyperlipidemia: Secondary | ICD-10-CM | POA: Diagnosis not present

## 2018-12-05 DIAGNOSIS — J019 Acute sinusitis, unspecified: Secondary | ICD-10-CM | POA: Diagnosis not present

## 2019-06-11 ENCOUNTER — Ambulatory Visit: Payer: Medicare Other | Admitting: Family Medicine

## 2019-06-13 ENCOUNTER — Other Ambulatory Visit: Payer: Self-pay

## 2019-06-13 ENCOUNTER — Ambulatory Visit (INDEPENDENT_AMBULATORY_CARE_PROVIDER_SITE_OTHER): Payer: Medicare Other | Admitting: Family Medicine

## 2019-06-13 ENCOUNTER — Encounter: Payer: Self-pay | Admitting: Family Medicine

## 2019-06-13 VITALS — Ht 60.0 in | Wt 180.1 lb

## 2019-06-13 DIAGNOSIS — B372 Candidiasis of skin and nail: Secondary | ICD-10-CM

## 2019-06-13 DIAGNOSIS — E7800 Pure hypercholesterolemia, unspecified: Secondary | ICD-10-CM | POA: Diagnosis not present

## 2019-06-13 DIAGNOSIS — Z3169 Encounter for other general counseling and advice on procreation: Secondary | ICD-10-CM

## 2019-06-13 DIAGNOSIS — F17210 Nicotine dependence, cigarettes, uncomplicated: Secondary | ICD-10-CM | POA: Diagnosis not present

## 2019-06-13 DIAGNOSIS — F3341 Major depressive disorder, recurrent, in partial remission: Secondary | ICD-10-CM | POA: Diagnosis not present

## 2019-06-13 DIAGNOSIS — Z8759 Personal history of other complications of pregnancy, childbirth and the puerperium: Secondary | ICD-10-CM

## 2019-06-13 DIAGNOSIS — J4540 Moderate persistent asthma, uncomplicated: Secondary | ICD-10-CM | POA: Diagnosis not present

## 2019-06-13 DIAGNOSIS — J302 Other seasonal allergic rhinitis: Secondary | ICD-10-CM | POA: Diagnosis not present

## 2019-06-13 MED ORDER — FLUCONAZOLE 150 MG PO TABS: 150.0000 mg | ORAL_TABLET | Freq: Once | ORAL | 0 refills | Status: AC

## 2019-06-13 MED ORDER — NYSTATIN 100000 UNIT/GM EX OINT: 1.0000 "application " | TOPICAL_OINTMENT | Freq: Two times a day (BID) | CUTANEOUS | 0 refills | Status: DC

## 2019-06-13 NOTE — Progress Notes (Signed)
Virtual Visit via Video   Due to the COVID-19 pandemic, this visit was completed with telemedicine (audio/video) technology to reduce patient and provider exposure as well as to preserve personal protective equipment.   I connected with Omari Koslosky by a video enabled telemedicine application and verified that I am speaking with the correct person using two identifiers. Location patient: Home Location provider: [facility], Office Persons participating in the virtual visit: Thomasene, [place], DO Lonell Grandchild, CMA acting as scribe for Dr. Briscoe Deutscher.   I discussed the limitations of evaluation and management by telemedicine and the availability of in person appointments. The patient expressed understanding and agreed to proceed.  Care Team   Patient Care Team: Briscoe Deutscher, DO as PCP - General (Family Medicine)  Subjective:   HPI: See Assessment and Plan section for Problem Based Charting of issues discussed today.   Review of Systems  Constitutional: Negative for chills, fever, malaise/fatigue and weight loss.  Respiratory: Negative for cough, shortness of breath and wheezing.   Cardiovascular: Negative for chest pain, palpitations and leg swelling.  Gastrointestinal: Negative for abdominal pain, constipation, diarrhea, nausea and vomiting.  Genitourinary: Negative for dysuria and urgency.  Musculoskeletal: Negative for joint pain and myalgias.  Skin: Negative for rash.  Neurological: Negative for dizziness and headaches.  Psychiatric/Behavioral: Negative for depression, substance abuse and suicidal ideas. The patient is not nervous/anxious.     Patient Active Problem List   Diagnosis Date Noted  . Moderate persistent asthma without complication 45/80/9983  . Recurrent major depressive disorder, in partial remission ([place]) 06/15/2019  . Seasonal allergies 06/15/2019  . Cigarette nicotine dependence without complication 38/25/0539  . Pure  hypercholesterolemia 06/15/2019  . Pre-conception counseling 06/15/2019  . History of ectopic pregnancy 06/15/2019    Social History   Tobacco Use  . Smoking status: Current Every Day Smoker    Types: Cigarettes  . Smokeless tobacco: Never Used  Substance Use Topics  . Alcohol use: No   Current Outpatient Medications:  .  albuterol (PROVENTIL HFA;VENTOLIN HFA) 108 (90 BASE) MCG/ACT inhaler, Inhale 2 puffs into the lungs every 6 (six) hours as needed for wheezing or shortness of breath., Disp: , Rfl:  .  buPROPion (WELLBUTRIN SR) 150 MG 12 hr tablet, Take 150 mg by mouth 2 (two) times daily., Disp: , Rfl:  .  Fluticasone-Salmeterol (ADVAIR) 250-50 MCG/DOSE AEPB, Inhale 1 puff into the lungs 2 (two) times daily., Disp: , Rfl:  .  montelukast (SINGULAIR) 10 MG tablet, Take 10 mg by mouth daily., Disp: , Rfl:  .  pravastatin (PRAVACHOL) 10 MG tablet, TAKE 1 TABLET BY MOUTH ONCE DAILY AT NIGHT AT BEDTIME, Disp: , Rfl:  .  nystatin ointment (MYCOSTATIN), Apply 1 application topically 2 (two) times daily., Disp: 30 g, Rfl: 0  Allergies  Allergen Reactions  . Codeine Other (See Comments)    Patient states she gets "loopy"    Objective:   VITALS: Per patient if applicable, see vitals. GENERAL: Alert, appears well and in no acute distress. HEENT: Atraumatic, conjunctiva clear, no obvious abnormalities on inspection of external nose and ears. NECK: Normal movements of the head and neck. CARDIOPULMONARY: No increased WOB. Speaking in clear sentences. I:E ratio WNL.  MS: Moves all visible extremities without noticeable abnormality. PSYCH: Pleasant and cooperative, well-groomed. Speech normal rate and rhythm. Affect is appropriate. Insight and judgement are appropriate. Attention is focused, linear, and appropriate.  NEURO: CN grossly intact. Oriented as arrived to appointment on time with  no prompting. Moves both UE equally.  SKIN: No obvious lesions, wounds, erythema, or cyanosis noted on  face or hands.  Depression screen PHQ 2/9 06/13/2019  Decreased Interest 1  Down, Depressed, Hopeless 1  PHQ - 2 Score 2  Altered sleeping 0  Tired, decreased energy 3  Change in appetite 1  Feeling bad or failure about yourself  3  Trouble concentrating 1  Moving slowly or fidgety/restless 0  Suicidal thoughts 0  PHQ-9 Score 10  Difficult doing work/chores Somewhat difficult   Assessment and Plan:   Moderate persistent asthma without complication Currently controlled with Advair and Singulair. Rarely needs Albuterol. Still smoking. Pre-contemplative. Discussed smoking cessation in order to have a safe pregnancy.   Recurrent major depressive disorder, in partial remission (HCC) Hx of depression with vegetative symptoms. Controlled with Wellbutrin. Will need to transition if she wants to pursue pregnancy. Will focus on lifestyle - sleep, exercise, healthy food choices.   Seasonal allergies Seasonal. Controlled at this time. Will continue current regimen.   Pure hypercholesterolemia Tolerating statin with no side effects. Will need labs to review. Will need to stop if wanting pregnancy.  Pre-conception counseling High risk. Hx of ectopic, smoker, on medications not suited for pregnancy. Will ask OBGYN to evaluate patient and help us going forward. Advised patient to start PNV. Will work on smoking cessation and healthy lifestyle changes.   Marland Kitchen. COVID-19 Education: The signs and symptoms of COVID-19 were discussed with the patient and how to seek care for testing if needed. The importance of social distancing was discussed today. . Reviewed expectations re: course of current medical issues. . Discussed self-management of symptoms. . Outlined signs and symptoms indicating need for more acute intervention. . Patient verbalized understanding and all questions were answered. Marland Kitchen. Health Maintenance issues including appropriate healthy diet, exercise, and smoking avoidance were discussed with  patient. . See orders for this visit as documented in the electronic medical record.  Helane RimaErica Merilee Wible, DO  Records requested if needed. Time spent: 30 minutes, of which >50% was spent in obtaining information about her symptoms, reviewing her previous labs, evaluations, and treatments, counseling her about her condition (please see the discussed topics above), and developing a plan to further investigate it; she had a number of questions which I addressed.

## 2019-06-15 ENCOUNTER — Encounter: Payer: Self-pay | Admitting: Family Medicine

## 2019-06-15 DIAGNOSIS — F3341 Major depressive disorder, recurrent, in partial remission: Secondary | ICD-10-CM | POA: Insufficient documentation

## 2019-06-15 DIAGNOSIS — F17210 Nicotine dependence, cigarettes, uncomplicated: Secondary | ICD-10-CM | POA: Insufficient documentation

## 2019-06-15 DIAGNOSIS — J302 Other seasonal allergic rhinitis: Secondary | ICD-10-CM | POA: Insufficient documentation

## 2019-06-15 DIAGNOSIS — J4540 Moderate persistent asthma, uncomplicated: Secondary | ICD-10-CM | POA: Insufficient documentation

## 2019-06-15 DIAGNOSIS — B372 Candidiasis of skin and nail: Secondary | ICD-10-CM | POA: Insufficient documentation

## 2019-06-15 DIAGNOSIS — E7800 Pure hypercholesterolemia, unspecified: Secondary | ICD-10-CM | POA: Insufficient documentation

## 2019-06-15 DIAGNOSIS — Z8759 Personal history of other complications of pregnancy, childbirth and the puerperium: Secondary | ICD-10-CM | POA: Insufficient documentation

## 2019-06-15 DIAGNOSIS — Z3169 Encounter for other general counseling and advice on procreation: Secondary | ICD-10-CM | POA: Insufficient documentation

## 2019-06-15 NOTE — Assessment & Plan Note (Signed)
Seasonal. Controlled at this time. Will continue current regimen.

## 2019-06-15 NOTE — Assessment & Plan Note (Signed)
High risk. Hx of ectopic, smoker, on medications not suited for pregnancy. Will ask OBGYN to evaluate patient and help [place] going forward. Advised patient to start PNV. Will work on smoking cessation and healthy lifestyle changes.

## 2019-06-15 NOTE — Assessment & Plan Note (Signed)
Tolerating statin with no side effects. Will need labs to review. Will need to stop if wanting pregnancy.

## 2019-06-15 NOTE — Assessment & Plan Note (Signed)
Currently controlled with Advair and Singulair. Rarely needs Albuterol. Still smoking. Pre-contemplative. Discussed smoking cessation in order to have a safe pregnancy.

## 2019-06-15 NOTE — Assessment & Plan Note (Signed)
Hx of depression with vegetative symptoms. Controlled with Wellbutrin. Will need to transition if she wants to pursue pregnancy. Will focus on lifestyle - sleep, exercise, healthy food choices.

## 2019-06-18 ENCOUNTER — Ambulatory Visit: Payer: Medicare Other | Admitting: Family Medicine

## 2019-07-09 ENCOUNTER — Ambulatory Visit (INDEPENDENT_AMBULATORY_CARE_PROVIDER_SITE_OTHER): Payer: Medicare Other | Admitting: Family Medicine

## 2019-07-09 ENCOUNTER — Other Ambulatory Visit: Payer: Self-pay | Admitting: Family Medicine

## 2019-07-09 DIAGNOSIS — E88819 Insulin resistance, unspecified: Secondary | ICD-10-CM

## 2019-07-09 DIAGNOSIS — R739 Hyperglycemia, unspecified: Secondary | ICD-10-CM

## 2019-07-09 DIAGNOSIS — Z114 Encounter for screening for human immunodeficiency virus [HIV]: Secondary | ICD-10-CM

## 2019-07-09 DIAGNOSIS — R5383 Other fatigue: Secondary | ICD-10-CM

## 2019-07-09 DIAGNOSIS — E7800 Pure hypercholesterolemia, unspecified: Secondary | ICD-10-CM

## 2019-07-09 DIAGNOSIS — Z8759 Personal history of other complications of pregnancy, childbirth and the puerperium: Secondary | ICD-10-CM

## 2019-07-09 DIAGNOSIS — Z13220 Encounter for screening for lipoid disorders: Secondary | ICD-10-CM

## 2019-07-09 DIAGNOSIS — E8881 Metabolic syndrome: Secondary | ICD-10-CM

## 2019-07-09 LAB — COMPREHENSIVE METABOLIC PANEL
ALT: 18 U/L (ref 0–35)
AST: 13 U/L (ref 0–37)
Albumin: 4.7 g/dL (ref 3.5–5.2)
Alkaline Phosphatase: 74 U/L (ref 39–117)
BUN: 8 mg/dL (ref 6–23)
CO2: 25 mEq/L (ref 19–32)
Calcium: 9.7 mg/dL (ref 8.4–10.5)
Chloride: 102 mEq/L (ref 96–112)
Creatinine, Ser: 0.63 mg/dL (ref 0.40–1.20)
GFR: 112.93 mL/min (ref >60.00)
Glucose, Bld: 92 mg/dL (ref 70–99)
Potassium: 4.1 mEq/L (ref 3.5–5.1)
Sodium: 136 mEq/L (ref 135–145)
Total Bilirubin: 0.4 mg/dL (ref 0.2–1.2)
Total Protein: 7.4 g/dL (ref 6.0–8.3)

## 2019-07-09 LAB — CBC WITH DIFFERENTIAL/PLATELET
Basophils Absolute: 0.1 10*3/uL (ref 0.0–0.1)
Basophils Relative: 0.9 % (ref 0.0–3.0)
Eosinophils Absolute: 0.2 10*3/uL (ref 0.0–0.7)
Eosinophils Relative: 1.5 % (ref 0.0–5.0)
HCT: 42.5 % (ref 36.0–46.0)
Hemoglobin: 14.1 g/dL (ref 12.0–15.0)
Lymphocytes Relative: 33.4 % (ref 12.0–46.0)
Lymphs Abs: 3.3 10*3/uL (ref 0.7–4.0)
MCHC: 33.2 g/dL (ref 30.0–36.0)
MCV: 87.3 fl (ref 78.0–100.0)
Monocytes Absolute: 0.5 10*3/uL (ref 0.1–1.0)
Monocytes Relative: 5.4 % (ref 3.0–12.0)
Neutro Abs: 5.8 10*3/uL (ref 1.4–7.7)
Neutrophils Relative %: 58.8 % (ref 43.0–77.0)
Platelets: 246 10*3/uL (ref 150.0–400.0)
RBC: 4.87 Mil/uL (ref 3.87–5.11)
RDW: 13.7 % (ref 11.5–15.5)
WBC: 9.9 10*3/uL (ref 4.0–10.5)

## 2019-07-09 LAB — LIPID PANEL
Cholesterol: 223 mg/dL — ABNORMAL HIGH (ref 0–200)
HDL: 34.9 mg/dL — ABNORMAL LOW (ref >39.00)
LDL Cholesterol: 151 mg/dL — ABNORMAL HIGH (ref 0–99)
NonHDL: 188.25
Total CHOL/HDL Ratio: 6
Triglycerides: 187 mg/dL — ABNORMAL HIGH (ref 0.0–149.0)
VLDL: 37.4 mg/dL (ref 0.0–40.0)

## 2019-07-09 LAB — HEMOGLOBIN A1C: Hgb A1c MFr Bld: 5.7 % (ref 4.6–6.5)

## 2019-07-09 LAB — TSH: TSH: 1.84 u[IU]/mL (ref 0.35–4.50)

## 2019-07-09 LAB — T4, FREE: Free T4: 1.14 ng/dL (ref 0.60–1.60)

## 2019-07-09 NOTE — Progress Notes (Signed)
Patient in office today with mother (patient being seen today). See last virtual visit. Labs today. Will schedule [place] as discussed. Working on smoking cessation. Taking PNV. Briscoe Deutscher, DO

## 2019-07-10 LAB — IRON,TIBC AND FERRITIN PANEL
%SAT: 21 % (calc) (ref 16–45)
Ferritin: 68 ng/mL (ref 16–154)
Iron: 69 ug/dL (ref 40–190)
TIBC: 331 mcg/dL (calc) (ref 250–450)

## 2019-07-10 LAB — HIV ANTIBODY (ROUTINE TESTING W REFLEX): HIV 1&2 Ab, 4th Generation: NONREACTIVE

## 2019-07-12 ENCOUNTER — Encounter: Payer: Self-pay | Admitting: Family Medicine

## 2019-07-12 MED ORDER — METFORMIN HCL 1000 MG PO TABS: ORAL_TABLET | ORAL | 3 refills | Status: DC

## 2019-07-12 NOTE — Addendum Note (Signed)
Addended by: Briscoe Deutscher R on: 07/12/2019 09:38 AM   Modules accepted: Orders

## 2019-07-14 ENCOUNTER — Ambulatory Visit: Payer: Medicare Other | Admitting: Family Medicine

## 2019-07-22 ENCOUNTER — Ambulatory Visit (HOSPITAL_COMMUNITY)
Admission: RE | Admit: 2019-07-22 | Discharge: 2019-07-22 | Disposition: A | Discharge status: Home / Self Care | Payer: Medicare Other | Source: Ambulatory Visit | Attending: Family Medicine | Admitting: Family Medicine

## 2019-07-22 ENCOUNTER — Other Ambulatory Visit: Payer: Self-pay

## 2019-07-22 DIAGNOSIS — Z8759 Personal history of other complications of pregnancy, childbirth and the puerperium: Secondary | ICD-10-CM | POA: Insufficient documentation

## 2019-07-24 ENCOUNTER — Encounter: Payer: Self-pay | Admitting: Family Medicine

## 2019-12-23 ENCOUNTER — Other Ambulatory Visit (INDEPENDENT_AMBULATORY_CARE_PROVIDER_SITE_OTHER): Payer: Self-pay | Admitting: Family Medicine

## 2019-12-23 NOTE — Telephone Encounter (Signed)
Medication Refill - Medication: albuterol (PROVENTIL HFA;VENTOLIN HFA) 108 (90 BASE) MCG/ACT inhaler   Patient is all out of her medication and needs her inhaler.   Preferred Pharmacy (with phone number or street name):  [facility] [address] [place] - [address] Phone:  [phone]  Fax:  [phone]       Agent: Please be advised that RX refills may take up to 3 business days. We ask that you follow-up with your pharmacy.

## 2019-12-23 NOTE — Telephone Encounter (Signed)
Requested medication (s) are due for refill today: yes  Requested medication (s) are on the active medication list: yes   Future visit scheduled: yes  Notes to clinic:  review for refill Patient is out of medication Last ordered: 5 years ago by Historical Provider, MD   Requested Prescriptions  Pending Prescriptions Disp Refills   albuterol (VENTOLIN HFA) 108 (90 Base) MCG/ACT inhaler       Sig: Inhale 2 puffs into the lungs every 6 (six) hours as needed for wheezing or shortness of breath.      There is no refill protocol information for this order

## 2020-01-02 ENCOUNTER — Other Ambulatory Visit: Payer: Self-pay

## 2020-01-05 ENCOUNTER — Other Ambulatory Visit: Payer: Self-pay

## 2020-01-05 ENCOUNTER — Encounter: Payer: Self-pay | Admitting: Physician Assistant

## 2020-01-05 ENCOUNTER — Ambulatory Visit (INDEPENDENT_AMBULATORY_CARE_PROVIDER_SITE_OTHER): Payer: Medicare Other | Admitting: Physician Assistant

## 2020-01-05 VITALS — BP 110/78 | HR 88 | Temp 97.8°F | Ht 60.0 in | Wt 180.5 lb

## 2020-01-05 DIAGNOSIS — E7800 Pure hypercholesterolemia, unspecified: Secondary | ICD-10-CM

## 2020-01-05 DIAGNOSIS — R739 Hyperglycemia, unspecified: Secondary | ICD-10-CM | POA: Diagnosis not present

## 2020-01-05 DIAGNOSIS — N96 Recurrent pregnancy loss: Secondary | ICD-10-CM | POA: Diagnosis not present

## 2020-01-05 DIAGNOSIS — F319 Bipolar disorder, unspecified: Secondary | ICD-10-CM | POA: Insufficient documentation

## 2020-01-05 DIAGNOSIS — Z23 Encounter for immunization: Secondary | ICD-10-CM | POA: Diagnosis not present

## 2020-01-05 MED ORDER — ALBUTEROL SULFATE HFA 108 (90 BASE) MCG/ACT IN AERS: 2.0000 | INHALATION_SPRAY | Freq: Four times a day (QID) | RESPIRATORY_TRACT | 1 refills | Status: DC | PRN

## 2020-01-05 NOTE — Patient Instructions (Addendum)
It was great to see you!  I will be in touch via MyChart with your lab results and further recommendations.  You will be contacted about your referral to gynecology and psychiatry. If you develop any suicidal thoughts, please tell someone and go to the ER.  Let's follow-up in 3 months, sooner if you have concerns.   Take care,  Jarold Motto PA-C

## 2020-01-05 NOTE — Addendum Note (Signed)
Addended by: Jimmye Norman on: 01/05/2020 03:19 PM   Modules accepted: Orders

## 2020-01-05 NOTE — Progress Notes (Signed)
Danielle Ramsey is a 29 y.o. female is here for transfer of care.  I acted as a Neurosurgeon for [employer], PA-C Corky Mull, LPN  History of Present Illness:   Chief Complaint  Patient presents with  . Transfer of care    HPI   Pt is transferring care today from Dr. Earlene Plater.  Anxiety & Depression Pt following up today, pt says she is more anxious and angry then depressed. She is currently on Wellbutrin 150 mg BID. Pt says she notices is angry and scared. When she was taking it as prescribed, she felt like a zombie. Currently taking once a day in the afternoon. She was started on Wellbutrin when she was on another medication, but cannot remember the name. She has a history of Bipolar disorder and currently doesn't see a psychiatrist. She gets disability for her bipolar disorder. Denies current SI/HI.  History of overdose attempt on ETOH and history of taking pills. She has history of self harm.  HLD Currently on pravastatin 10 mg daily. Currently tolerating well. Denies myalgias.  History of miscarriages She has a history of miscarriage and an ectopic pregnancy. She states that she has been trying to get pregnant for several years with her boyfriend but has been unsuccessful. She is concerned that she may not be able to get pregnant.  Insulin resistance Last HgbA1c was 5.7%. Currently well controlled and taking metformin 1000 mg daily. She tolerates this well but would like to see if she still needs this medication.   Health Maintenance Due  Topic Date Due  . TETANUS/TDAP  01/06/2011  . PAP-Cervical Cytology Screening  01/06/2013  . PAP SMEAR-Modifier  01/06/2013    Past Medical History:  Diagnosis Date  . Asthma   . Depression   . Ectopic pregnancy   . Hyperlipidemia      Social History   Socioeconomic History  . Marital status: Single    Spouse name: Not on file  . Number of children: Not on file  . Years of education: Not on file  . Highest education  level: Not on file  Occupational History  . Not on file  Tobacco Use  . Smoking status: Current Every Day Smoker    Types: Cigarettes  . Smokeless tobacco: Never Used  Substance and Sexual Activity  . Alcohol use: No  . Drug use: No  . Sexual activity: Not on file  Other Topics Concern  . Not on file  Social History Narrative   Currently on disability for bipolar type I disorder   Was hospitalized at age 7-18   Social Determinants of Health   Financial Resource Strain:   . Difficulty of Paying Living Expenses: Not on file  Food Insecurity:   . Worried About Programme researcher, broadcasting/film/video in the Last Year: Not on file  . Ran Out of Food in the Last Year: Not on file  Transportation Needs:   . Lack of Transportation (Medical): Not on file  . Lack of Transportation (Non-Medical): Not on file  Physical Activity:   . Days of Exercise per Week: Not on file  . Minutes of Exercise per Session: Not on file  Stress:   . Feeling of Stress : Not on file  Social Connections:   . Frequency of Communication with Friends and Family: Not on file  . Frequency of Social Gatherings with Friends and Family: Not on file  . Attends Religious Services: Not on file  . Active Member of Clubs or Organizations:  Not on file  . Attends Banker Meetings: Not on file  . Marital Status: Not on file  Intimate Partner Violence:   . Fear of Current or Ex-Partner: Not on file  . Emotionally Abused: Not on file  . Physically Abused: Not on file  . Sexually Abused: Not on file    Past Surgical History:  Procedure Laterality Date  . APPENDECTOMY      Family History  Problem Relation Age of Onset  . Diabetes Mother   . Hypertension Mother   . Early death Father 83  . Stroke Paternal Grandmother     PMHx, SurgHx, SocialHx, FamHx, Medications, and Allergies were reviewed in the Visit Navigator and updated as appropriate.   Patient Active Problem List   Diagnosis Date Noted  . Bipolar 1  disorder (HCC) 01/05/2020  . Moderate persistent asthma without complication 06/15/2019  . Recurrent major depressive disorder, in partial remission (HCC) 06/15/2019  . Seasonal allergies 06/15/2019  . Cigarette nicotine dependence without complication 06/15/2019  . Pure hypercholesterolemia 06/15/2019  . Pre-conception counseling 06/15/2019  . History of ectopic pregnancy 06/15/2019    Social History   Tobacco Use  . Smoking status: Current Every Day Smoker    Types: Cigarettes  . Smokeless tobacco: Never Used  Substance Use Topics  . Alcohol use: No  . Drug use: No    Current Medications and Allergies:    Current Outpatient Medications:  .  albuterol (VENTOLIN HFA) 108 (90 Base) MCG/ACT inhaler, Inhale 2 puffs into the lungs every 6 (six) hours as needed for wheezing or shortness of breath., Disp: 18 g, Rfl: 1 .  buPROPion (WELLBUTRIN SR) 150 MG 12 hr tablet, Take 150 mg by mouth 2 (two) times daily., Disp: , Rfl:  .  metFORMIN (GLUCOPHAGE) 1000 MG tablet, Start with 1/2 tab twice daily and increase to 1 full tab twice daily as tolerated. (Patient taking differently: Take 1,000 mg by mouth 2 (two) times daily with a meal. Start with 1/2 tab twice daily and increase to 1 full tab twice daily as tolerated.), Disp: 180 tablet, Rfl: 3 .  montelukast (SINGULAIR) 10 MG tablet, Take 10 mg by mouth daily., Disp: , Rfl:  .  nystatin ointment (MYCOSTATIN), Apply 1 application topically 2 (two) times daily. (Patient taking differently: Apply 1 application topically as needed. ), Disp: 30 g, Rfl: 0 .  pravastatin (PRAVACHOL) 10 MG tablet, TAKE 1 TABLET BY MOUTH ONCE DAILY AT NIGHT AT BEDTIME, Disp: , Rfl:    Allergies  Allergen Reactions  . Codeine Other (See Comments)    Patient states she gets "loopy"    Review of Systems   ROS Negative unless otherwise specified per HPI.  Vitals:   Vitals:   01/05/20 1322  BP: 110/78  Pulse: 88  Temp: 97.8 F (36.6 C)  TempSrc: Temporal   SpO2: 97%  Weight: 180 lb 8 oz (81.9 kg)  Height: 5' (1.524 m)     Body mass index is 35.25 kg/m.   Physical Exam:    Physical Exam Vitals and nursing note reviewed.  Constitutional:      General: She is not in acute distress.    Appearance: She is well-developed. She is not ill-appearing or toxic-appearing.  Cardiovascular:     Rate and Rhythm: Normal rate and regular rhythm.     Pulses: Normal pulses.     Heart sounds: Normal heart sounds, S1 normal and S2 normal.     Comments: No LE edema  Pulmonary:     Effort: Pulmonary effort is normal.     Breath sounds: Normal breath sounds.  Skin:    General: Skin is warm and dry.  Neurological:     Mental Status: She is alert.     GCS: GCS eye subscore is 4. GCS verbal subscore is 5. GCS motor subscore is 6.  Psychiatric:        Speech: Speech normal.        Behavior: Behavior normal. Behavior is cooperative.      Assessment and Plan:    Oliviagrace was seen today for transfer of care.  Diagnoses and all orders for this visit:  Pure hypercholesterolemia Will update lipid panel. She knows that she is not supposed to continue her statin if she were to become pregnant. Will strongly consider discontinuing based on lipid panel results. -     Lipid panel  Bipolar 1 disorder (HCC) Uncontrolled. I discussed with patient that if they develop any SI, to tell someone immediately and seek medical attention. Referral to psychiatry. -     Ambulatory referral to Psychiatry  Hyperglycemia Update HgbA1c. She would like to come off of this medication if she can. -     Hemoglobin A1c  History of recurrent miscarriages -     Ambulatory referral to Gynecology  . Reviewed expectations re: course of current medical issues. . Discussed self-management of symptoms. . Outlined signs and symptoms indicating need for more acute intervention. . Patient verbalized understanding and all questions were answered. . See orders for this visit as  documented in the electronic medical record. . Patient received an After Visit Summary.  CMA or LPN served as scribe during this visit. History, Physical, and Plan performed by medical provider. The above documentation has been reviewed and is accurate and complete.  Inda Coke, PA-C [place] [place] 01/05/2020  I spent 40 minutes with this patient, greater than 50% was face-to-face time counseling regarding the above diagnoses.  Follow-up: No follow-ups on file.

## 2020-01-05 NOTE — Telephone Encounter (Signed)
Never given at our office has TOC app with you to day ok to fill?

## 2020-01-06 ENCOUNTER — Other Ambulatory Visit: Payer: Self-pay | Admitting: Physician Assistant

## 2020-01-06 LAB — LIPID PANEL
Cholesterol: 227 mg/dL — ABNORMAL HIGH (ref 0–200)
HDL: 35 mg/dL — ABNORMAL LOW (ref >39.00)
LDL Cholesterol: 153 mg/dL — ABNORMAL HIGH (ref 0–99)
NonHDL: 191.9
Total CHOL/HDL Ratio: 6
Triglycerides: 194 mg/dL — ABNORMAL HIGH (ref 0.0–149.0)
VLDL: 38.8 mg/dL (ref 0.0–40.0)

## 2020-01-06 LAB — HEMOGLOBIN A1C: Hgb A1c MFr Bld: 5.6 % (ref 4.6–6.5)

## 2020-01-16 DIAGNOSIS — N390 Urinary tract infection, site not specified: Secondary | ICD-10-CM | POA: Diagnosis not present

## 2020-01-16 LAB — HM PAP SMEAR: HM Pap smear: NORMAL

## 2020-01-30 DIAGNOSIS — I10 Essential (primary) hypertension: Secondary | ICD-10-CM | POA: Diagnosis not present

## 2020-03-08 DIAGNOSIS — Z79891 Long term (current) use of opiate analgesic: Secondary | ICD-10-CM | POA: Diagnosis not present

## 2020-04-05 ENCOUNTER — Ambulatory Visit: Payer: Medicare Other | Admitting: Physician Assistant

## 2020-04-09 IMAGING — US US PELVIS COMPLETE
1 series · 15 of 25 positions shown · non-contrast
Comparison: CT abdomen and pelvis 01/08/2014

CLINICAL DATA: Pain, irregular menses, history of ectopic pregnancy

EXAM:
TRANSABDOMINAL ULTRASOUND OF PELVIS
TECHNIQUE: Transabdominal ultrasound examination of the pelvis was performed
including evaluation of the uterus, ovaries, adnexal regions, and
pelvic cul-de-sac.

[Series 1: us pelvis complete · 15 of 27 slices shown]
[im 1/27]
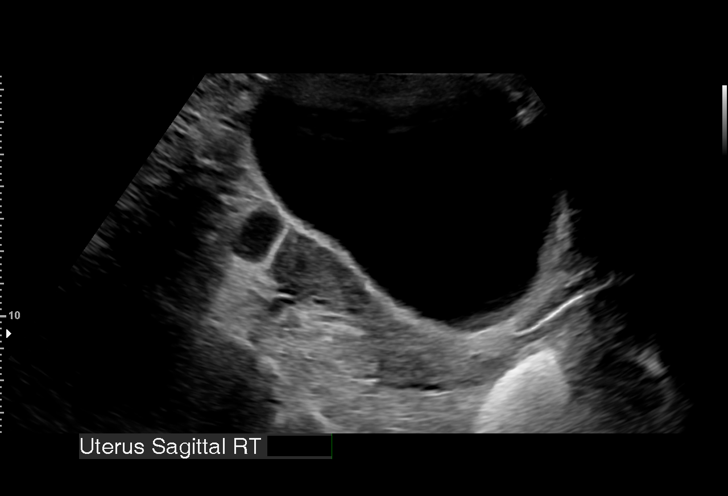
[im 3/27]
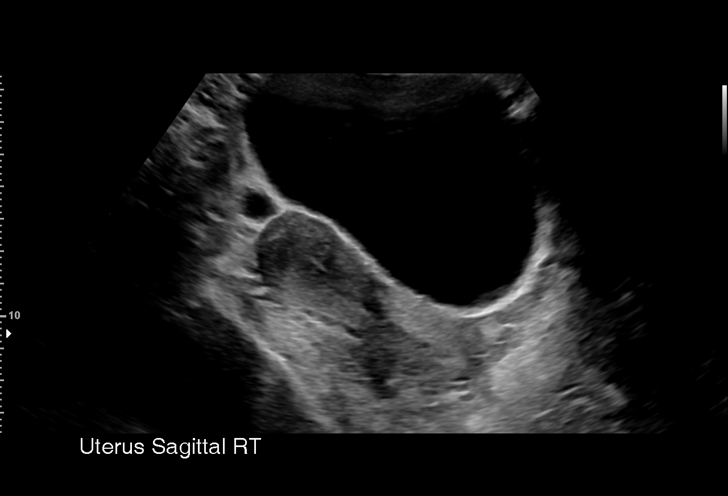
[im 5/27]
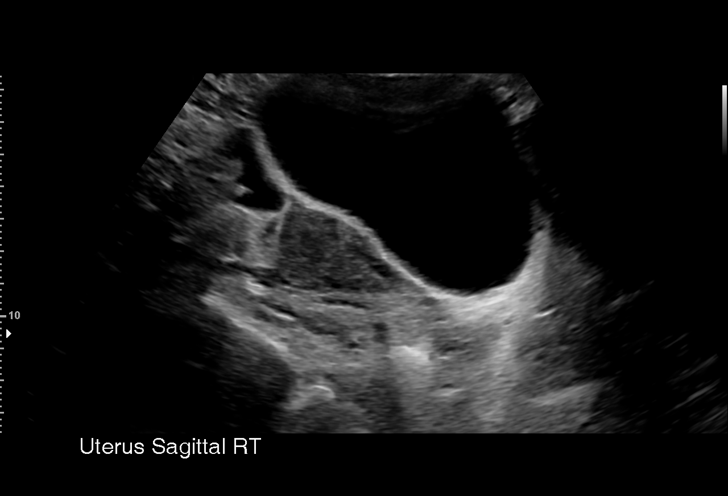
[im 6/27]
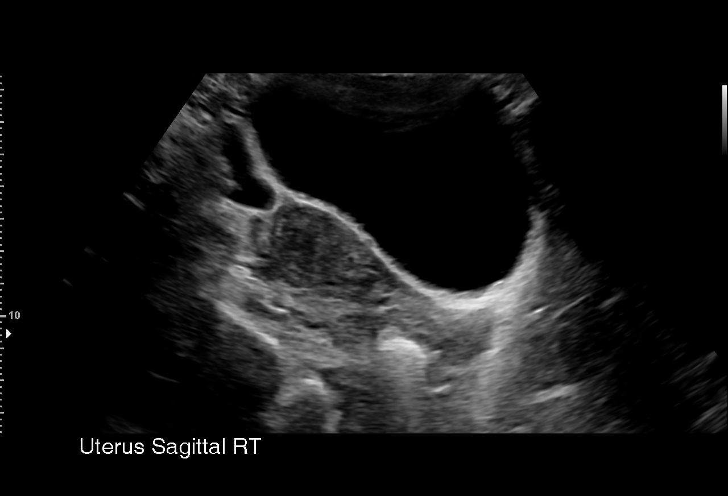
[im 8/27]
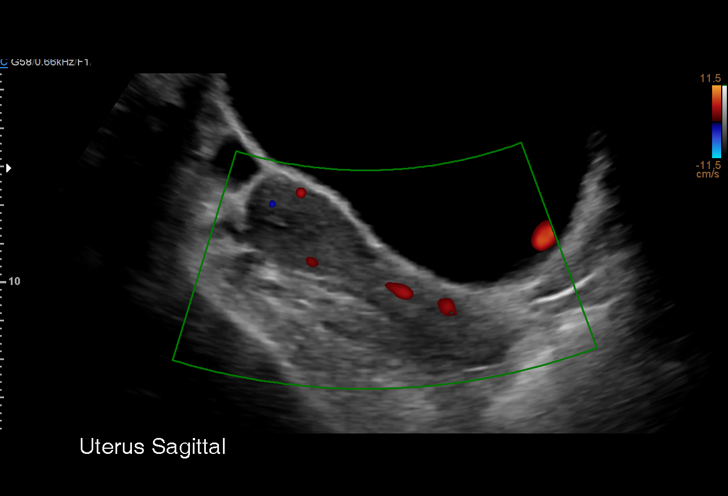
[im 10/27]
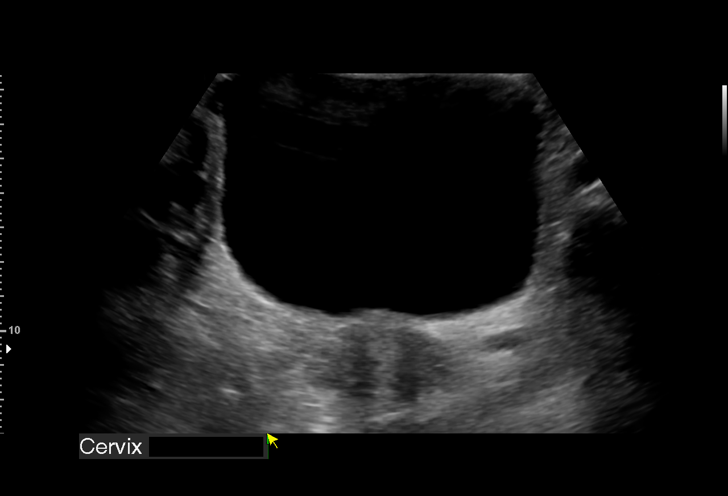
[im 11/27]
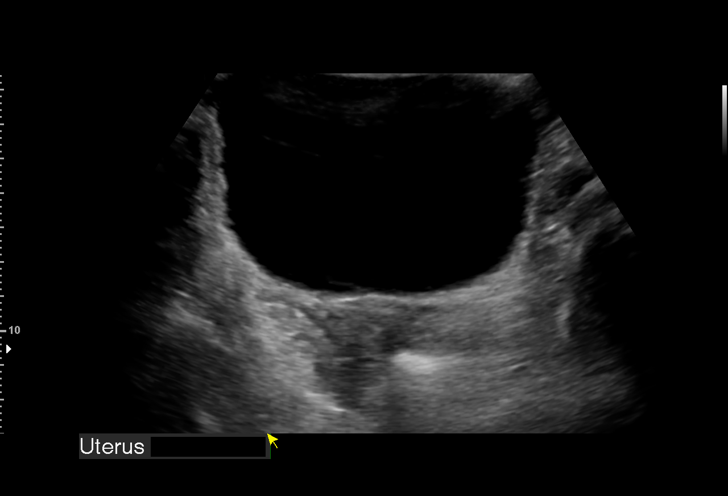
[im 14/27]
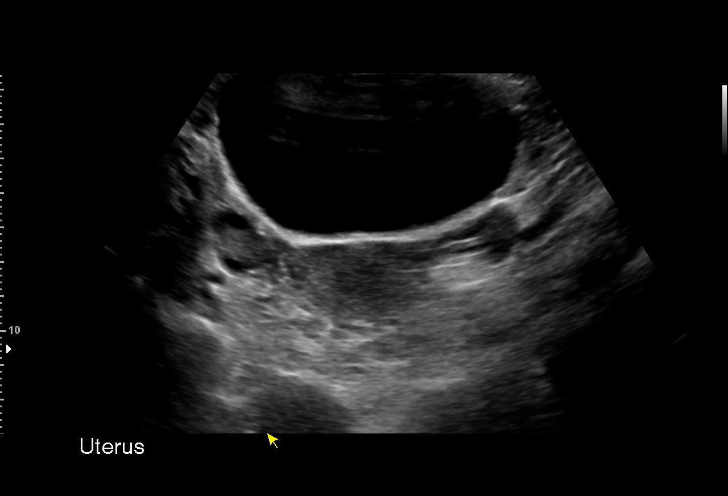
[im 16/27]
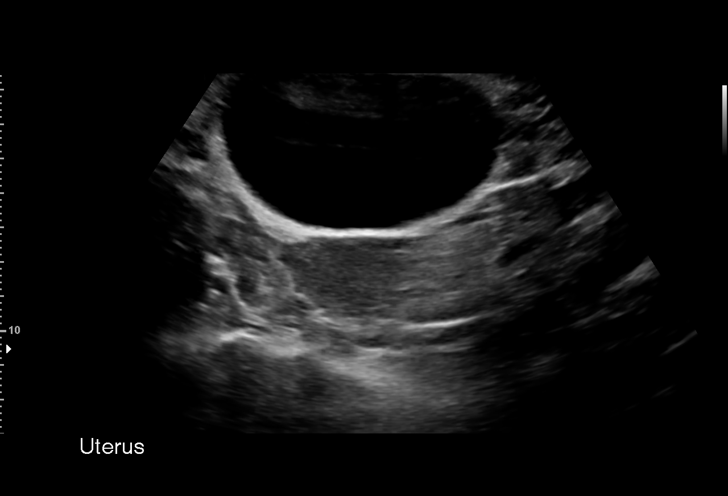
[im 17/27]
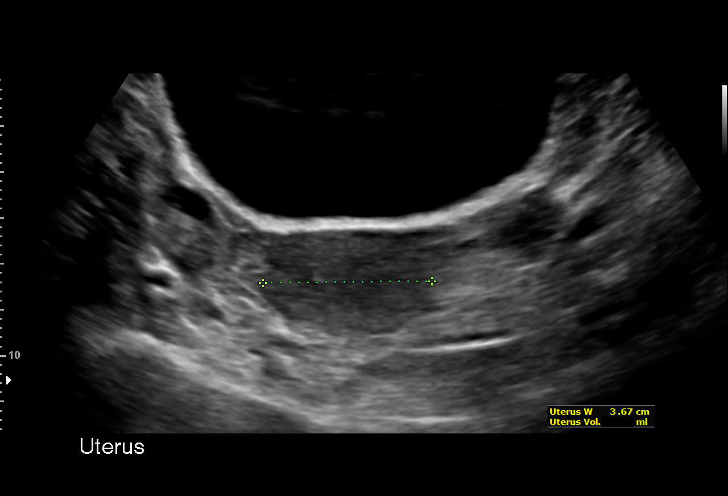
[im 19/27]
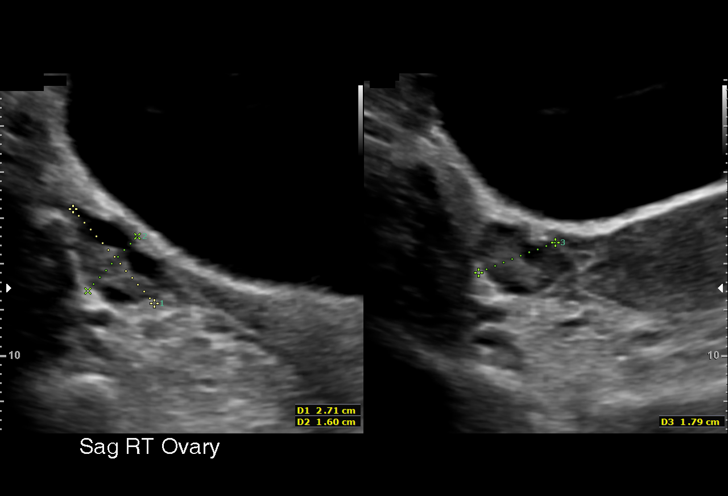
[im 21/27]
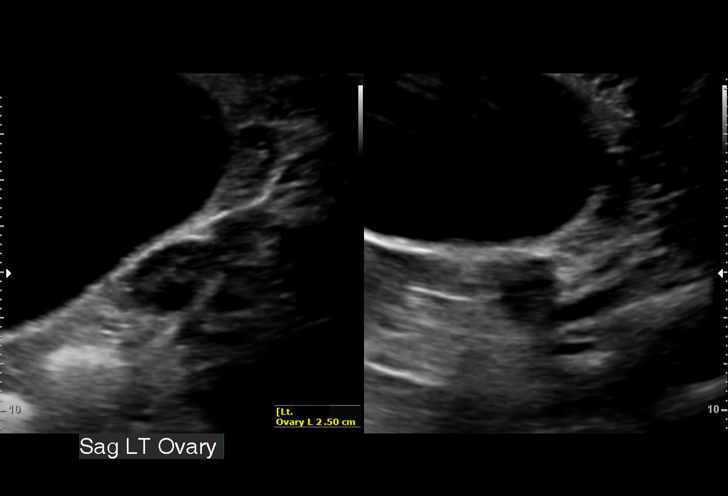
[im 22/27]
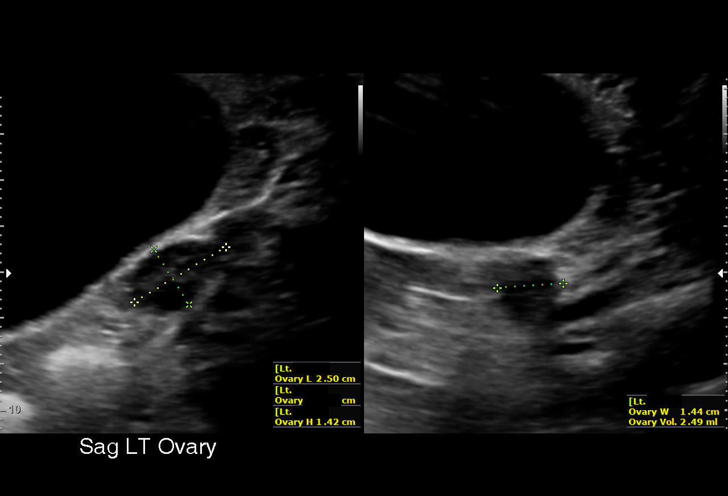
[im 24/27]
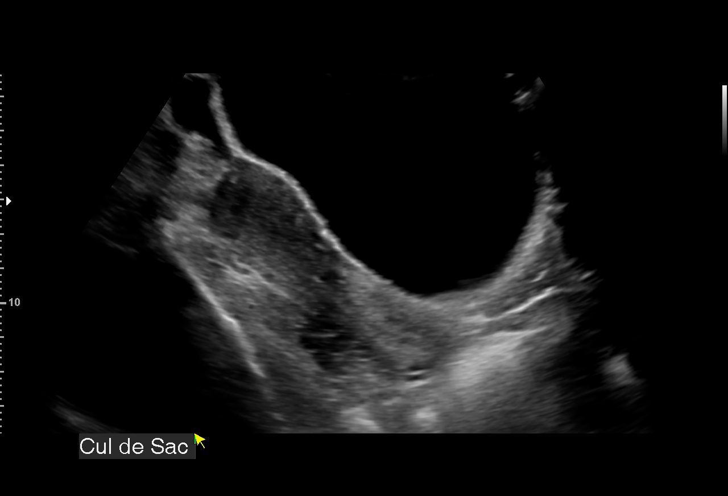
[im 27/27]
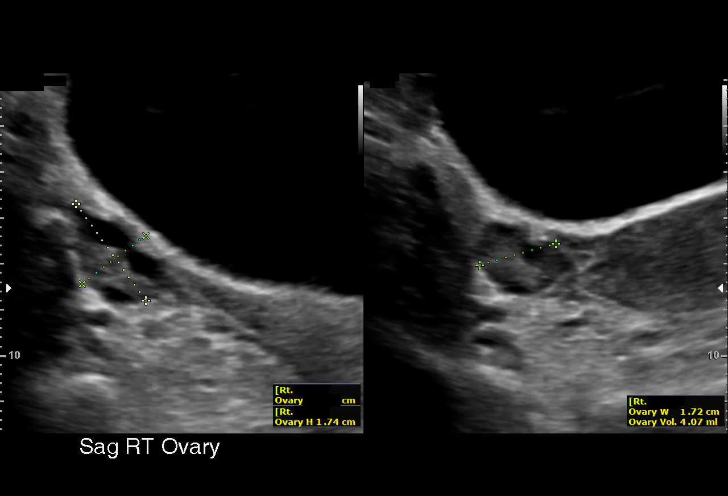

[15 of 25 positions shown; findings below may reference images not displayed]

FINDINGS: Uterus

Measurements: 8.0 x 2.1 x 3.7 cm = volume: 33 mL. Anteverted. Normal
morphology without mass

Endometrium

Thickness: 6 mm.  No endometrial fluid or focal abnormality

Right ovary

Measurements: 2.7 x 1.8 x 1.6 cm = volume: 4.1 mL. Normal morphology
without mass

Left ovary

Measurements: 2.5 x 1.4 x 1.4 cm = volume: 2.6 mL. Normal morphology
without mass

Other findings: No free pelvic fluid. No adnexal masses. Visualized
bladder unremarkable.
IMPRESSION: Normal exam.

## 2020-04-12 ENCOUNTER — Ambulatory Visit (INDEPENDENT_AMBULATORY_CARE_PROVIDER_SITE_OTHER): Payer: Medicare Other

## 2020-04-12 ENCOUNTER — Other Ambulatory Visit: Payer: Self-pay

## 2020-04-12 DIAGNOSIS — Z0000 Encounter for general adult medical examination without abnormal findings: Secondary | ICD-10-CM | POA: Diagnosis not present

## 2020-04-12 DIAGNOSIS — F319 Bipolar disorder, unspecified: Secondary | ICD-10-CM | POA: Diagnosis not present

## 2020-04-12 NOTE — Progress Notes (Addendum)
This visit is being conducted via phone call due to the COVID-19 pandemic. This patient has given me verbal consent via phone to conduct this visit, patient states they are participating from their home address. Some vital signs may be absent or patient reported.   Patient identification: identified by name, DOB, and current address.  Location provider: [facility], Office Persons participating in the virtual visit: Denman George LPN, patient, and Inda Coke PA     Subjective:   Danielle Ramsey is a 29 y.o. female who presents for an Initial Medicare Annual Wellness Visit.  Review of Systems     Cardiac Risk Factors include: smoking/ tobacco exposure    Objective:    There were no vitals filed for this visit. There is no height or weight on file to calculate BMI.  Advanced Directives 04/12/2020  Does Patient Have a Medical Advance Directive? No  Would patient like information on creating a medical advance directive? Yes (MAU/Ambulatory/Procedural Areas - Information given)    Current Medications (verified) Outpatient Encounter Medications as of 04/12/2020  Medication Sig  . albuterol (VENTOLIN HFA) 108 (90 Base) MCG/ACT inhaler Inhale 2 puffs into the lungs every 6 (six) hours as needed for wheezing or shortness of breath.  . Lurasidone HCl (LATUDA PO) Take by mouth daily.  . montelukast (SINGULAIR) 10 MG tablet Take 10 mg by mouth daily.  . [DISCONTINUED] buPROPion (WELLBUTRIN SR) 150 MG 12 hr tablet Take 150 mg by mouth 2 (two) times daily.  . [DISCONTINUED] nystatin ointment (MYCOSTATIN) Apply 1 application topically 2 (two) times daily. (Patient taking differently: Apply 1 application topically as needed. )   No facility-administered encounter medications on file as of 04/12/2020.    Allergies (verified) Codeine   History: Past Medical History:  Diagnosis Date  . Asthma   . Depression   . Ectopic pregnancy   . Hyperlipidemia    Past Surgical History:    Procedure Laterality Date  . APPENDECTOMY     Family History  Problem Relation Age of Onset  . Diabetes Mother   . Hypertension Mother   . Early death Father 69  . Stroke Paternal Grandmother    Social History   Socioeconomic History  . Marital status: Single    Spouse name: Not on file  . Number of children: Not on file  . Years of education: Not on file  . Highest education level: Not on file  Occupational History  . Not on file  Tobacco Use  . Smoking status: Current Every Day Smoker    Packs/day: 1.00    Types: Cigarettes  . Smokeless tobacco: Never Used  Substance and Sexual Activity  . Alcohol use: No  . Drug use: No  . Sexual activity: Not on file  Other Topics Concern  . Not on file  Social History Narrative   Currently on disability for bipolar type I disorder   Was hospitalized at age 74-18   Social Determinants of Health   Financial Resource Strain:   . Difficulty of Paying Living Expenses:   Food Insecurity:   . Worried About Charity fundraiser in the Last Year:   . Arboriculturist in the Last Year:   Transportation Needs:   . Film/video editor (Medical):   Marland Kitchen Lack of Transportation (Non-Medical):   Physical Activity:   . Days of Exercise per Week:   . Minutes of Exercise per Session:   Stress:   . Feeling of Stress :  Social Connections:   . Frequency of Communication with Friends and Family:   . Frequency of Social Gatherings with Friends and Family:   . Attends Religious Services:   . Active Member of Clubs or Organizations:   . Attends Banker Meetings:   Marland Kitchen Marital Status:     Tobacco Counseling Ready to quit: No Counseling given: Not Answered   Clinical Intake:  Pre-visit preparation completed: Yes  Pain : No/denies pain  Diabetes: No  How often do you need to have someone help you when you read instructions, pamphlets, or other written materials from your doctor or pharmacy?: 1 - Never     Information  entered by :: Kandis Fantasia LPN   Activities of Daily Living In your present state of health, do you have any difficulty performing the following activities: 04/12/2020 01/05/2020  Hearing? N Y  Comment - sometimes  Vision? N N  Difficulty concentrating or making decisions? N Y  Walking or climbing stairs? N N  Dressing or bathing? N N  Doing errands, shopping? N N  Preparing Food and eating ? N -  Using the Toilet? N -  In the past six months, have you accidently leaked urine? N -  Do you have problems with loss of bowel control? N -  Managing your Medications? N -  Managing your Finances? N -  Housekeeping or managing your Housekeeping? N -  Some recent data might be hidden     Immunizations and Health Maintenance Immunization History  Administered Date(s) Administered  . Tdap 01/05/2020   Health Maintenance Due  Topic Date Due  . COVID-19 Vaccine (1) Never done  . PAP-Cervical Cytology Screening  Never done  . PAP SMEAR-Modifier  Never done    Patient Care Team: Jarold Motto, PA as PCP - General (Physician Assistant) Center, [facility] as Consulting Physician (Behavioral Health) Marina Goodell, MD as Referring Physician (Obstetrics and Gynecology)  Indicate any recent Medical Services you may have received from other than [facility] providers in the past year (date may be approximate).     Assessment:   This is a routine wellness examination for Danielle Ramsey.  Hearing/Vision screen No exam data present  Dietary issues and exercise activities discussed: Current Exercise Habits: The patient does not participate in regular exercise at present  Goals   None    Depression Screen PHQ 2/9 Scores 04/12/2020 01/05/2020 06/13/2019  PHQ - 2 Score 3 4 2   PHQ- 9 Score 8 16 10     Fall Risk Fall Risk  04/12/2020 06/13/2019  Falls in the past year? 0 0  Number falls in past yr: 0 0  Injury with Fall? 0 0  Risk for fall due to : No Fall Risks -    Follow up Falls evaluation completed;Education provided;Falls prevention discussed -    Is the patient's home free of loose throw rugs in walkways, pet beds, electrical cords, etc?   yes      Grab bars in the bathroom? yes      Handrails on the stairs?   yes      Adequate lighting?   yes    Cognitive Function: no cognitive concerns at this time   Screening Tests Health Maintenance  Topic Date Due  . COVID-19 Vaccine (1) Never done  . PAP-Cervical Cytology Screening  Never done  . PAP SMEAR-Modifier  Never done  . INFLUENZA VACCINE  07/18/2020  . TETANUS/TDAP  01/04/2030  . HIV Screening  Completed  Cancer Screenings: Lung: Low Dose CT Chest recommended if Age 27-80 years, 30 pack-year currently smoking OR have quit w/in 15years. Patient does not qualify. Breast: Up to date on Mammogram? Yes   Up to date of Bone Density/Dexa? Yes Colorectal: Not indicated    Plan:  I have personally reviewed and addressed the Medicare Annual Wellness questionnaire and have noted the following in the patient's chart:  A. Medical and social history B. Use of alcohol, tobacco or illicit drugs  C. Current medications and supplements D. Functional ability and status E.  Nutritional status F.  Physical activity G. Advance directives H. List of other physicians I.  Hospitalizations, surgeries, and ER visits in previous 12 months J.  Vitals K. Screenings such as hearing and vision if needed, cognitive and depression L. Referrals, records requested, and appointments- last pap results   In addition, I have reviewed and discussed with patient certain preventive protocols, quality metrics, and best practice recommendations. A written personalized care plan for preventive services as well as general preventive health recommendations were provided to patient.   Signed,  Kandis Fantasia, LPN  Nurse Health Advisor   Nurse Notes: Patient would like assistance in finding therapist that accepts her  insurance.  [employer] referral entered.   I have reviewed documentation for AWV and Advance Care planning provided by [facility], I agree with documentation, I was immediately available for any questions. Jarold Motto, [place]

## 2020-04-12 NOTE — Patient Instructions (Signed)
Danielle Ramsey , Thank you for taking time to come for your Medicare Wellness Visit. I appreciate your ongoing commitment to your health goals. Please review the following plan we discussed and let me know if I can assist you in the future.   Screening recommendations/referrals: Colorectal Screening: starting at age 28 Mammogram: starting at age 39  Bone Density: starting at 45  Vision and Dental Exams: Recommended annual ophthalmology exams for early detection of glaucoma and other disorders of the eye Recommended annual dental exams for proper oral hygiene  Vaccinations: Influenza vaccine: recommended each fall  Tdap vaccine: up to date; last 01/05/20 Covid vaccine: recommended   Advanced directives:I have provided a copy for you to complete at home and have notarized. Once this is complete please bring a copy in to our office so we can scan it into your chart.  Goals: Recommend to drink at least 6-8 8oz glasses of water per day and consume a balanced diet rich in fresh fruits and vegetables.   Next appointment: Please schedule your Annual Wellness Visit with your Nurse Health Advisor in one year.  Preventive Care Female Preventive care refers to lifestyle choices and visits with your health care provider that can promote health and wellness. What does preventive care include?  A yearly physical exam. This is also called an annual well check.  Dental exams once or twice a year.  Routine eye exams. Ask your health care provider how often you should have your eyes checked.  Personal lifestyle choices, including:  Daily care of your teeth and gums.  Regular physical activity.  Eating a healthy diet.  Avoiding tobacco and drug use.  Limiting alcohol use.  Practicing safe sex.  Taking low-dose aspirin daily starting at age 96 if recommended by your health care provider.  Taking vitamin and mineral supplements as recommended by your health care provider. What happens during an  annual well check? The services and screenings done by your health care provider during your annual well check will depend on your age, overall health, lifestyle risk factors, and family history of disease. Counseling  Your health care provider may ask you questions about your:  Alcohol use.  Tobacco use.  Drug use.  Emotional well-being.  Home and relationship well-being.  Sexual activity.  Eating habits.  Work and work Statistician.  Method of birth control.  Menstrual cycle.  Pregnancy history. Screening  You may have the following tests or measurements:  Height, weight, and BMI.  Blood pressure.  Lipid and cholesterol levels. These may be checked every 5 years, or more frequently if you are over 6 years old.  Skin check.  Lung cancer screening. You may have this screening every year starting at age 86 if you have a 30-pack-year history of smoking and currently smoke or have quit within the past 15 years.  Fecal occult blood test (FOBT) of the stool. You may have this test every year starting at age 15.  Flexible sigmoidoscopy or colonoscopy. You may have a sigmoidoscopy every 5 years or a colonoscopy every 10 years starting at age 70.  Hepatitis C blood test.  Hepatitis B blood test.  Sexually transmitted disease (STD) testing.  Diabetes screening. This is done by checking your blood sugar (glucose) after you have not eaten for a while (fasting). You may have this done every 1-3 years.  Mammogram. This may be done every 1-2 years. Talk to your health care provider about when you should start having regular mammograms. This may  depend on whether you have a family history of breast cancer.  BRCA-related cancer screening. This may be done if you have a family history of breast, ovarian, tubal, or peritoneal cancers.  Pelvic exam and Pap test. This may be done every 3 years starting at age 21. Starting at age 30, this may be done every 5 years if you have a Pap  test in combination with an HPV test.  Bone density scan. This is done to screen for osteoporosis. You may have this scan if you are at high risk for osteoporosis. Discuss your test results, treatment options, and if necessary, the need for more tests with your health care provider. Vaccines  Your health care provider may recommend certain vaccines, such as:  Influenza vaccine. This is recommended every year.  Tetanus, diphtheria, and acellular pertussis (Tdap, Td) vaccine. You may need a Td booster every 10 years.  Zoster vaccine. You may need this after age 60.  Pneumococcal 13-valent conjugate (PCV13) vaccine. You may need this if you have certain conditions and were not previously vaccinated.  Pneumococcal polysaccharide (PPSV23) vaccine. You may need one or two doses if you smoke cigarettes or if you have certain conditions. Talk to your health care provider about which screenings and vaccines you need and how often you need them. This information is not intended to replace advice given to you by your health care provider. Make sure you discuss any questions you have with your health care provider. Document Released: 12/31/2015 Document Revised: 08/23/2016 Document Reviewed: 10/05/2015 Elsevier Interactive Patient Education  2017 Elsevier Inc.    

## 2020-04-15 ENCOUNTER — Encounter: Payer: Self-pay | Admitting: Physician Assistant

## 2020-04-21 ENCOUNTER — Ambulatory Visit: Payer: Medicare Other | Admitting: Physician Assistant

## 2020-04-22 ENCOUNTER — Telehealth: Payer: Self-pay | Admitting: Physician Assistant

## 2020-04-22 NOTE — Telephone Encounter (Signed)
°  Community Resource Referral   [facility] 04/22/2020   Name: Danielle Ramsey    MRN: [id number]    DOB: [birth year]    AGE: 29 y.o.    GENDER: female    PCP Jarold Motto, [place].   Called pt regarding [employer] Referral for counselors who accept Medicaid and Medicare. Spoke with mother and asked that I send mychart msg with resources. Sent MYChart Msg to pt  Ms. Errington, Here are some counseling resources that accept Medicaid. Please let me know if you need anything further.  Mental Health Resources that accept [facility] - Counseling; accepts Medicaid; services available in Spanish -  [address]  [phone] https://www.[facility].org/about/  [facility] the [place] https://www.fspcares.org/mental-service-substance-abuse/ Accepts Medicaid 616-802-4911  Psychology Today The database on Psychology Today offers a search based on zip code and Medicaid Here are the results for your review https://www.psychologytoday.com/us/therapists/medicaid/27288  Manuela Schwartz  Care Guide  Embedded Care Coordination [facility] Management [email or web address] [email or web address]  [phone]    Last read by Berton Mount at 3:48 PM on 04/22/2020.   Closing referral pending any other needs of patient.  Manuela Schwartz  Care Guide  Embedded Care Coordination [facility] Management [email or web address]   [phone]

## 2020-04-23 ENCOUNTER — Ambulatory Visit: Payer: Medicare Other | Admitting: Physician Assistant

## 2020-04-23 NOTE — Progress Notes (Deleted)
Danielle Ramsey is a 28 y.o. female is here for a follow up.  I acted as a Education administrator for [employer], PA-C [employer], [place]  History of Present Illness:   No chief complaint on file.   HPI  Anxiety & Depression  Insulin resistance Last A1c was 5.6%. Patient previously was on Metformin, but does not take any medication now.  Health Maintenance Due  Topic Date Due  . COVID-19 Vaccine (1) Never done    Past Medical History:  Diagnosis Date  . Asthma   . Depression   . Ectopic pregnancy   . Hyperlipidemia      Social History   Socioeconomic History  . Marital status: Single    Spouse name: Not on file  . Number of children: Not on file  . Years of education: Not on file  . Highest education level: Not on file  Occupational History  . Not on file  Tobacco Use  . Smoking status: Current Every Day Smoker    Packs/day: 1.00    Types: Cigarettes  . Smokeless tobacco: Never Used  Substance and Sexual Activity  . Alcohol use: No  . Drug use: No  . Sexual activity: Not on file  Other Topics Concern  . Not on file  Social History Narrative   Currently on disability for bipolar type I disorder   Was hospitalized at age 70-18   Social Determinants of Health   Financial Resource Strain:   . Difficulty of Paying Living Expenses:   Food Insecurity:   . Worried About Charity fundraiser in the Last Year:   . Arboriculturist in the Last Year:   Transportation Needs:   . Film/video editor (Medical):   Marland Kitchen Lack of Transportation (Non-Medical):   Physical Activity:   . Days of Exercise per Week:   . Minutes of Exercise per Session:   Stress:   . Feeling of Stress :   Social Connections:   . Frequency of Communication with Friends and Family:   . Frequency of Social Gatherings with Friends and Family:   . Attends Religious Services:   . Active Member of Clubs or Organizations:   . Attends Archivist Meetings:   Marland Kitchen Marital Status:   Intimate Partner  Violence:   . Fear of Current or Ex-Partner:   . Emotionally Abused:   Marland Kitchen Physically Abused:   . Sexually Abused:     Past Surgical History:  Procedure Laterality Date  . APPENDECTOMY      Family History  Problem Relation Age of Onset  . Diabetes Mother   . Hypertension Mother   . Early death Father 9  . Stroke Paternal Grandmother     PMHx, SurgHx, SocialHx, FamHx, Medications, and Allergies were reviewed in the Visit Navigator and updated as appropriate.   Patient Active Problem List   Diagnosis Date Noted  . Bipolar 1 disorder ([place]) 01/05/2020  . Moderate persistent asthma without complication 32/99/2426  . Recurrent major depressive disorder, in partial remission ([place]) 06/15/2019  . Seasonal allergies 06/15/2019  . Cigarette nicotine dependence without complication 83/41/9622  . Pure hypercholesterolemia 06/15/2019  . Pre-conception counseling 06/15/2019  . History of ectopic pregnancy 06/15/2019    Social History   Tobacco Use  . Smoking status: Current Every Day Smoker    Packs/day: 1.00    Types: Cigarettes  . Smokeless tobacco: Never Used  Substance Use Topics  . Alcohol use: No  . Drug use: No  Current Medications and Allergies:    Current Outpatient Medications:  .  albuterol (VENTOLIN HFA) 108 (90 Base) MCG/ACT inhaler, Inhale 2 puffs into the lungs every 6 (six) hours as needed for wheezing or shortness of breath., Disp: 18 g, Rfl: 1 .  Lurasidone HCl (LATUDA PO), Take by mouth daily., Disp: , Rfl:  .  montelukast (SINGULAIR) 10 MG tablet, Take 10 mg by mouth daily., Disp: , Rfl:   Allergies  Allergen Reactions  . Codeine Other (See Comments)    Patient states she gets "loopy"    Review of Systems   ROS  Vitals:  There were no vitals filed for this visit.   There is no height or weight on file to calculate BMI.   Physical Exam:    Physical Exam   Assessment and Plan:    There are no diagnoses linked to this  encounter.  . Reviewed expectations re: course of current medical issues. . Discussed self-management of symptoms. . Outlined signs and symptoms indicating need for more acute intervention. . Patient verbalized understanding and all questions were answered. . See orders for this visit as documented in the electronic medical record. . Patient received an After Visit Summary.  ***  Jarold Motto, PA-C [place], [place] 04/23/2020  Follow-up: No follow-ups on file.

## 2020-08-27 ENCOUNTER — Telehealth: Payer: Self-pay | Admitting: Physician Assistant

## 2020-08-27 MED ORDER — ALBUTEROL SULFATE HFA 108 (90 BASE) MCG/ACT IN AERS: 2.0000 | INHALATION_SPRAY | Freq: Four times a day (QID) | RESPIRATORY_TRACT | 2 refills | Status: DC | PRN

## 2020-08-27 NOTE — Telephone Encounter (Signed)
Initial Comment Caller states that she is low on her inhaler medication and she's needing someone to call it in. No current symptoms. Translation No Nurse Assessment Nurse: Raymon Mutton, RN, Misty Stanley Date/Time (Eastern Time): 08/26/2020 5:59:44 PM Confirm and document reason for call. If symptomatic, describe symptoms. ---Caller states she is almost out of her Educational psychologist. No refills have been available at [facility]. [facility] faxed them for refill several days ago. She states she is not having problems with her asthma now but gets nervous about running out of her Proair. She last saw PCP for asthma within the last 6 months. Denies current symptoms. Has the patient had close contact with a person known or suspected to have the novel coronavirus illness OR traveled / lives in area with major community spread (including international travel) in the last 14 days from the onset of symptoms? * If Asymptomatic, screen for exposure and travel within the last 14 days. ---No Does the patient have any new or worsening symptoms? ---No Please document clinical information provided and list any resource used. ---[facility] [place] [phone] Disp. Time Lamount Cohen Time) Disposition Final User 08/26/2020 5:39:21 PM Attempt made - message left Raymon Mutton, RN, Misty Stanley 08/26/2020 6:08:20 PM Pharmacy Call Raymon Mutton, RN, Misty Stanley Reason: Proair Inhaler sent to [facility] per client directives 08/26/2020 6:09:22 PM Clinical Call Yes Raymon Mutton, RN, Misty Stanley PLEASE NOTE: All timestamps contained within this report are represented as [place] Standard Time. CONFIDENTIALTY NOTICE: This fax transmission is intended only for the addressee. It contains information that is legally privileged, confidential or otherwise protected from use or disclosure. If you are not the intended recipient, you are strictly prohibited from reviewing, disclosing, copying using or disseminating any of this information or taking any action in reliance on or regarding this  information. If you have received this fax in error, please notify us immediately by telephone so that we can arrange for its return to [place]. Phone: [phone], Toll-Free: [phone], Fax: [phone] Page: 2 of 2 Call Id: [id number] Comments User: Gaylyn Lambert, RN Date/Time Lamount Cohen Time): 08/26/2020 5:40:40 PM No answer secondary number User: Gaylyn Lambert, RN Date/Time Lamount Cohen Time): 08/26/2020 6:05:51 PM Nurse may call in refills on maintenance medications: o Volume sufficient until office opens o Refills only called into pharmacy where originally filled, otherwise will have to call back once - Rx sent to [facility] per client directive. She will contact office for her additional refills

## 2020-08-27 NOTE — Telephone Encounter (Signed)
Spoke to pt told her Rx for Inhaler was sent to the pharmacy and I have not received any requests from the pharmacy due to it was in Dr. Philis Pique name. Pt verbalized understanding.

## 2020-09-30 ENCOUNTER — Telehealth: Payer: Self-pay | Admitting: Family Medicine

## 2020-09-30 DIAGNOSIS — M25572 Pain in left ankle and joints of left foot: Secondary | ICD-10-CM

## 2020-09-30 DIAGNOSIS — M79672 Pain in left foot: Secondary | ICD-10-CM

## 2020-09-30 NOTE — Telephone Encounter (Signed)
Patient is scheduled to see Dr Denyse Amass tomorrow morning at 11:15 for a left ankle injury. She said that she fell on Sunday and is still having a lot of pain and swelling. Would you like to order an xray for her to have done prior to coming in at [place]?

## 2020-09-30 NOTE — Telephone Encounter (Signed)
Yes ordered xray of foot and ankle (Left) for patient to go to [facility] prior to appointment.

## 2020-10-01 ENCOUNTER — Ambulatory Visit: Payer: Self-pay

## 2020-10-01 ENCOUNTER — Ambulatory Visit (INDEPENDENT_AMBULATORY_CARE_PROVIDER_SITE_OTHER)
Admission: RE | Admit: 2020-10-01 | Discharge: 2020-10-01 | Disposition: A | Discharge status: Home / Self Care | Payer: Medicare Other | Source: Ambulatory Visit | Attending: Family Medicine | Admitting: Family Medicine

## 2020-10-01 ENCOUNTER — Encounter: Payer: Self-pay | Admitting: Family Medicine

## 2020-10-01 ENCOUNTER — Ambulatory Visit (INDEPENDENT_AMBULATORY_CARE_PROVIDER_SITE_OTHER): Payer: Medicare Other | Admitting: Family Medicine

## 2020-10-01 ENCOUNTER — Other Ambulatory Visit: Payer: Self-pay

## 2020-10-01 VITALS — BP 130/80 | HR 78 | Ht 60.0 in | Wt 189.0 lb

## 2020-10-01 DIAGNOSIS — M25572 Pain in left ankle and joints of left foot: Secondary | ICD-10-CM

## 2020-10-01 DIAGNOSIS — S93492A Sprain of other ligament of left ankle, initial encounter: Secondary | ICD-10-CM | POA: Diagnosis not present

## 2020-10-01 DIAGNOSIS — M79672 Pain in left foot: Secondary | ICD-10-CM

## 2020-10-01 NOTE — Progress Notes (Signed)
    Subjective:    CC: L ankle pain and swelling  I, Debbe Odea, am serving as a Neurosurgeon for Dr. Clementeen Graham.  HPI: Pt is a 29 y/o female presenting w/ c/o L ankle pain and swelling .  She locates her pain to Lateral/back of L ankle.  Patient was walking down an mulch incline and when she got to the concrete bottom that leveled out her L ankle rolled/twisted had a warm sensation and thought it was bleeding. patient state that she tried getting into anywhere but it was 10+ wait and feels waiting too long she may had caused more issues.  Radiating pain: only with certain steps and it will shoot up calf L ankle swelling: yes L ankle mechanical symptoms:no Aggravating factors: walking: twisting, flexion Treatments tried: R.I.C.E  Diagnostic testing: L ankle and L foot XR- 10/01/20  Pertinent review of Systems: No fevers or chills  Relevant historical information: Depression, asthma, bipolar   Objective:    Vitals:   10/01/20 1035  BP: 130/80  Pulse: 78  SpO2: 98%   General: Well Developed, well nourished, and in no acute distress.   MSK: Left ankle abrasion overlying the anterior aspect of the lateral malleolus and dorsal lateral foot at ATFL region.  Minimal effusion.  No surrounding skin erythema. Normal motion. Tender palpation ATFL region. Stable ligamentous exam. Intact strength however some pain with resisted dorsiflexion and eversion. Pulses cap refill and sensation are intact distally.  Lab and Radiology Results  X-ray images left foot and ankle obtained today personally and independently interpreted.  Left ankle: No fracture or significant arthritis malalignment.  Left foot no fracture significant arthritis or malalignment present.  Await formal radiology review   Impression and Recommendations:    Assessment and Plan: 29 y.o. female with left ankle sprain.  X-ray no fractures per my interpretation however radiology overread is still pending.  Plan  for ASO brace and physical therapy.  Recheck in 4 weeks or so if not improved.Marland Kitchen  PDMP not reviewed this encounter. Orders Placed This Encounter  Procedures  . Ambulatory referral to Physical Therapy    Referral Priority:   Routine    Referral Type:   Physical Medicine    Referral Reason:   Specialty Services Required    Requested Specialty:   Physical Therapy    Number of Visits Requested:   1   No orders of the defined types were placed in this encounter.   Discussed warning signs or symptoms. Please see discharge instructions. Patient expresses understanding.   The above documentation has been reviewed and is accurate and complete Clementeen Graham, M.D.

## 2020-10-01 NOTE — Patient Instructions (Addendum)
Thank you for coming in today.  I've referred you to Physical Therapy.  Let us know if you don't hear from them in one week.  Please go to [facility] supply to get the ASO ankle brace we talked about today. You may also be able to get it from [employer].  Could also use cam walker boot if needed.   Recheck in 6 weeks if needed.    Ankle Sprain, Phase I Rehab An ankle sprain is an injury to the ligaments of your ankle. Ankle sprains cause stiffness, loss of motion, and loss of strength. Ask your health care provider which exercises are safe for you. Do exercises exactly as told by your health care provider and adjust them as directed. It is normal to feel mild stretching, pulling, tightness, or discomfort as you do these exercises. Stop right away if you feel sudden pain or your pain gets worse. Do not begin these exercises until told by your health care provider. Stretching and range-of-motion exercises These exercises warm up your muscles and joints and improve the movement and flexibility of your lower leg and ankle. These exercises also help to relieve pain and stiffness. Gastroc and soleus stretch This exercise is also called a calf stretch. It stretches the muscles in the back of the lower leg. These muscles are the gastrocnemius, or gastroc, and the soleus. 1. Sit on the floor with your left / right leg extended. 2. Loop a belt or towel around the ball of your left / right foot. The ball of your foot is on the walking surface, right under your toes. 3. Keep your left / right ankle and foot relaxed and keep your knee straight while you use the belt or towel to pull your foot toward you. You should feel a gentle stretch behind your calf or knee in your gastroc muscle. 4. Hold this position for __________ seconds, then release to the starting position. 5. Repeat the exercise with your knee bent. You can put a pillow or a rolled bath towel under your knee to support it. You should feel a stretch  deep in your calf in the soleus muscle or at your Achilles tendon. Repeat __________ times. Complete this exercise __________ times a day. Ankle alphabet  1. Sit with your left / right leg supported at the lower leg. ? Do not rest your foot on anything. ? Make sure your foot has room to move freely. 2. Think of your left / right foot as a paintbrush. ? Move your foot to trace each letter of the alphabet in the air. Keep your hip and knee still while you trace. ? Make the letters as large as you can without feeling discomfort. 3. Trace every letter from A to Z. Repeat __________ times. Complete this exercise __________ times a day. Strengthening exercises These exercises build strength and endurance in your ankle and lower leg. Endurance is the ability to use your muscles for a long time, even after they get tired. Ankle dorsiflexion  1. Secure a rubber exercise band or tube to an object, such as a table leg, that will stay still when the band is pulled. Secure the other end around your left / right foot. 2. Sit on the floor facing the object, with your left / right leg extended. The band or tube should be slightly tense when your foot is relaxed. 3. Slowly bring your foot toward you, bringing the top of your foot toward your shin (dorsiflexion), and pulling the band  tighter. 4. Hold this position for __________ seconds. 5. Slowly return your foot to the starting position. Repeat __________ times. Complete this exercise __________ times a day. Ankle plantar flexion  1. Sit on the floor with your left / right leg extended. 2. Loop a rubber exercise tube or band around the ball of your left / right foot. The ball of your foot is on the walking surface, right under your toes. ? Hold the ends of the band or tube in your hands. ? The band or tube should be slightly tense when your foot is relaxed. 3. Slowly point your foot and toes downward to tilt the top of your foot away from your shin  (plantar flexion). 4. Hold this position for __________ seconds. 5. Slowly return your foot to the starting position. Repeat __________ times. Complete this exercise __________ times a day. Ankle eversion 1. Sit on the floor with your legs straight out in front of you. 2. Loop a rubber exercise band or tube around the ball of your left / right foot. The ball of your foot is on the walking surface, right under your toes. ? Hold the ends of the band in your hands, or secure the band to a stable object. ? The band or tube should be slightly tense when your foot is relaxed. 3. Slowly push your foot outward, away from your other leg (eversion). 4. Hold this position for __________ seconds. 5. Slowly return your foot to the starting position. Repeat __________ times. Complete this exercise __________ times a day. This information is not intended to replace advice given to you by your health care provider. Make sure you discuss any questions you have with your health care provider. Document Revised: 03/25/2019 Document Reviewed: 09/16/2018 Elsevier Patient Education  2020 [employer].

## 2020-10-04 NOTE — Progress Notes (Signed)
Foot x-ray looks normal to radiology

## 2020-10-04 NOTE — Progress Notes (Signed)
Ankle x-ray looks normal to radiology

## 2020-10-08 DIAGNOSIS — M25572 Pain in left ankle and joints of left foot: Secondary | ICD-10-CM | POA: Diagnosis not present

## 2020-10-08 DIAGNOSIS — S93492D Sprain of other ligament of left ankle, subsequent encounter: Secondary | ICD-10-CM | POA: Diagnosis not present

## 2020-10-12 ENCOUNTER — Other Ambulatory Visit: Payer: Self-pay | Admitting: Physician Assistant

## 2020-10-12 ENCOUNTER — Telehealth: Payer: Self-pay

## 2020-10-12 MED ORDER — ALBUTEROL SULFATE HFA 108 (90 BASE) MCG/ACT IN AERS: 2.0000 | INHALATION_SPRAY | Freq: Four times a day (QID) | RESPIRATORY_TRACT | 2 refills | Status: DC | PRN

## 2020-10-12 NOTE — Telephone Encounter (Signed)
Medication refilled

## 2020-10-12 NOTE — Telephone Encounter (Signed)
MEDICATION: Albuterol inhaler  PHARMACY: [facility] [address]  Comments:   **Let patient know to contact pharmacy at the end of the day to make sure medication is ready. **  ** Please notify patient to allow 48-72 hours to process**  **Encourage patient to contact the pharmacy for refills or they can request refills through [facility]**

## 2020-10-13 ENCOUNTER — Other Ambulatory Visit: Payer: Self-pay | Admitting: *Deleted

## 2020-10-13 DIAGNOSIS — S93492D Sprain of other ligament of left ankle, subsequent encounter: Secondary | ICD-10-CM | POA: Diagnosis not present

## 2020-10-13 DIAGNOSIS — M25572 Pain in left ankle and joints of left foot: Secondary | ICD-10-CM | POA: Diagnosis not present

## 2020-10-13 MED ORDER — ALBUTEROL SULFATE HFA 108 (90 BASE) MCG/ACT IN AERS: 2.0000 | INHALATION_SPRAY | Freq: Four times a day (QID) | RESPIRATORY_TRACT | 2 refills | Status: DC | PRN

## 2020-10-20 DIAGNOSIS — M25572 Pain in left ankle and joints of left foot: Secondary | ICD-10-CM | POA: Diagnosis not present

## 2020-10-20 DIAGNOSIS — S93492D Sprain of other ligament of left ankle, subsequent encounter: Secondary | ICD-10-CM | POA: Diagnosis not present

## 2020-10-27 DIAGNOSIS — S93492D Sprain of other ligament of left ankle, subsequent encounter: Secondary | ICD-10-CM | POA: Diagnosis not present

## 2020-10-27 DIAGNOSIS — M25572 Pain in left ankle and joints of left foot: Secondary | ICD-10-CM | POA: Diagnosis not present

## 2020-11-03 DIAGNOSIS — S93492D Sprain of other ligament of left ankle, subsequent encounter: Secondary | ICD-10-CM | POA: Diagnosis not present

## 2020-11-03 DIAGNOSIS — M25572 Pain in left ankle and joints of left foot: Secondary | ICD-10-CM | POA: Diagnosis not present

## 2020-11-03 NOTE — Progress Notes (Deleted)
   I, Philbert Riser, LAT, ATC acting as a scribe for Clementeen Graham, MD.  Danielle Ramsey is a 29 y.o. female who presents to [facility] today for f/u L ankle sprain (ATF sprain). Pt was last seen by Dr. Denyse Amass on 10/01/20 and was advised to wear ASO brace and was referred to PT of which she's completed 0 visits. Today, pt reports  Dx imaging: 10/01/20 L foot and ankle  Pertinent review of systems: ***  Relevant historical information: ***   Exam:  There were no vitals taken for this visit. General: Well Developed, well nourished, and in no acute distress.   MSK: ***    Lab and Radiology Results No results found for this or any previous visit (from the past 72 hour(s)). No results found.     Assessment and Plan: 29 y.o. female with ***   PDMP not reviewed this encounter. No orders of the defined types were placed in this encounter.  No orders of the defined types were placed in this encounter.    Discussed warning signs or symptoms. Please see discharge instructions. Patient expresses understanding.   ***

## 2020-11-04 ENCOUNTER — Ambulatory Visit: Payer: Medicare Other | Admitting: Family Medicine

## 2020-11-10 DIAGNOSIS — M25572 Pain in left ankle and joints of left foot: Secondary | ICD-10-CM | POA: Diagnosis not present

## 2020-11-10 DIAGNOSIS — S93492D Sprain of other ligament of left ankle, subsequent encounter: Secondary | ICD-10-CM | POA: Diagnosis not present

## 2020-11-16 DIAGNOSIS — S93492D Sprain of other ligament of left ankle, subsequent encounter: Secondary | ICD-10-CM | POA: Diagnosis not present

## 2020-11-16 DIAGNOSIS — M25572 Pain in left ankle and joints of left foot: Secondary | ICD-10-CM | POA: Diagnosis not present

## 2020-12-28 DIAGNOSIS — H5213 Myopia, bilateral: Secondary | ICD-10-CM | POA: Diagnosis not present

## 2021-01-11 DIAGNOSIS — H5213 Myopia, bilateral: Secondary | ICD-10-CM | POA: Diagnosis not present

## 2021-01-11 DIAGNOSIS — H52223 Regular astigmatism, bilateral: Secondary | ICD-10-CM | POA: Diagnosis not present

## 2022-07-26 ENCOUNTER — Encounter (INDEPENDENT_AMBULATORY_CARE_PROVIDER_SITE_OTHER): Payer: Self-pay

## 2022-09-11 ENCOUNTER — Encounter: Payer: Self-pay | Admitting: *Deleted

## 2022-10-24 ENCOUNTER — Encounter: Payer: Self-pay | Admitting: Physician Assistant

## 2022-10-24 ENCOUNTER — Ambulatory Visit (INDEPENDENT_AMBULATORY_CARE_PROVIDER_SITE_OTHER): Payer: Medicare Other | Admitting: Physician Assistant

## 2022-10-24 VITALS — BP 120/72 | HR 101 | Temp 98.0°F | Ht 60.0 in | Wt 171.2 lb

## 2022-10-24 DIAGNOSIS — E669 Obesity, unspecified: Secondary | ICD-10-CM | POA: Diagnosis not present

## 2022-10-24 DIAGNOSIS — E7800 Pure hypercholesterolemia, unspecified: Secondary | ICD-10-CM | POA: Diagnosis not present

## 2022-10-24 DIAGNOSIS — Z0000 Encounter for general adult medical examination without abnormal findings: Secondary | ICD-10-CM | POA: Diagnosis not present

## 2022-10-24 DIAGNOSIS — F319 Bipolar disorder, unspecified: Secondary | ICD-10-CM

## 2022-10-24 DIAGNOSIS — F17210 Nicotine dependence, cigarettes, uncomplicated: Secondary | ICD-10-CM

## 2022-10-24 NOTE — Progress Notes (Signed)
Subjective:    Danielle Ramsey is a 31 y.o. female and is here for a comprehensive physical exam.  HPI  She is here with her mother during today's visit.   Health Maintenance Due  Topic Date Due   Hepatitis C Screening  Never done   Medicare Annual Wellness (AWV)  04/12/2021    Acute Concerns: None  Chronic Issues: Bipolar disorder: She notes that she has discontinued her Latuda because she makes her sleepy while she is on it. She has not been to her psychiatrist for her mental health, but notes she will schedule appointment with her psychiatrist.  Hyperlipidemia: She is interested in getting lab work for cholesterol. She is currently not taking any medication for hyperlipidemia. She was on pravastatin in the past. Lab Results  Component Value Date   CHOL 227 (H) 01/05/2020   HDL 35.00 (L) 01/05/2020   LDLCALC 153 (H) 01/05/2020   TRIG 194.0 (H) 01/05/2020   CHOLHDL 6 01/05/2020    Health Maintenance: Social History-- She is still smoking cigarettes, but notes that she has cut down to 1/2 a pack a day. Her grandmother had skin cancer. She does not know about paternal family history. She denies any family history of colon cancer. She currently lives with her mother, ex-boyfriend, and her nephew.   PAP -- Last completed on 01/16/2020.  Diet -- She maintains a healthy diet.   UTD with dentist? - She is UTD with dentist.  UTD with eye doctor? - She is UTD with eye doctor.   Weight history: Wt Readings from Last 10 Encounters:  10/24/22 171 lb 4 oz (77.7 kg)  10/01/20 189 lb (85.7 kg)  01/05/20 180 lb 8 oz (81.9 kg)  06/13/19 180 lb 1.6 oz (81.7 kg)  01/08/14 170 lb (77.1 kg)   Body mass index is 33.44 kg/m. Patient's last menstrual period was 10/19/2022 (exact date).  Alcohol use:  reports no history of alcohol use.  Tobacco use:  Tobacco Use: High Risk (10/24/2022)   Patient History    Smoking Tobacco Use: Every Day    Smokeless Tobacco Use: Never    Passive  Exposure: Not on file   Eligible for lung cancer screening? no     10/24/2022    3:45 PM  Depression screen PHQ 2/9  Decreased Interest 1  Down, Depressed, Hopeless 1  PHQ - 2 Score 2  Altered sleeping 0  Tired, decreased energy 2  Change in appetite 0  Feeling bad or failure about yourself  3  Trouble concentrating 2  Moving slowly or fidgety/restless 0  Suicidal thoughts 1  PHQ-9 Score 10  Difficult doing work/chores Somewhat difficult     Other providers/specialists: Patient Care Team: Inda Coke, [place] as PCP - General (Physician Assistant) Center, [place] as Consulting Physician ([place]) Arlan Organ, MD as Referring Physician (Obstetrics and Gynecology)    PMHx, SurgHx, SocialHx, Medications, and Allergies were reviewed in the Visit Navigator and updated as appropriate.   Past Medical History:  Diagnosis Date   Asthma    Depression    Ectopic pregnancy    Hyperlipidemia      Past Surgical History:  Procedure Laterality Date   APPENDECTOMY       Family History  Problem Relation Age of Onset   Diabetes Mother    Hypertension Mother    Early death Father 27   Stroke Paternal Grandmother    Colon cancer Neg Hx    Breast cancer Neg  Hx     Social History   Tobacco Use   Smoking status: Every Day    Packs/day: 1.00    Types: Cigarettes   Smokeless tobacco: Never  Substance Use Topics   Alcohol use: No   Drug use: No    Review of Systems:   Review of Systems  Constitutional:  Negative for chills, fever, malaise/fatigue and weight loss.  HENT:  Negative for hearing loss, sinus pain and sore throat.   Respiratory:  Negative for cough and hemoptysis.   Cardiovascular:  Negative for chest pain, palpitations, orthopnea, leg swelling and PND.  Gastrointestinal:  Negative for abdominal pain, constipation, diarrhea, heartburn, nausea and vomiting.  Genitourinary:  Negative for dysuria, frequency and  urgency.  Musculoskeletal:  Negative for back pain, myalgias and neck pain.  Skin:  Negative for itching and rash.  Endo/Heme/Allergies:  Negative for polydipsia.  Psychiatric/Behavioral:  Negative for depression. The patient is not nervous/anxious.     Objective:   BP 120/72 (BP Location: Left Arm, Patient Position: Sitting, Cuff Size: Large)   Pulse (!) 101   Temp 98 F (36.7 C) (Temporal)   Ht 5' (1.524 m)   Wt 171 lb 4 oz (77.7 kg)   LMP 10/19/2022 (Exact Date)   SpO2 97%   BMI 33.44 kg/m  Body mass index is 33.44 kg/m.   General Appearance:    Alert, cooperative, no distress, appears stated age  Head:    Normocephalic, without obvious abnormality, atraumatic  Eyes:    PERRL, conjunctiva/corneas clear, EOM's intact, fundi    benign, both eyes  Ears:    Normal TM's and external ear canals, both ears  Nose:   Nares normal, septum midline, mucosa normal, no drainage    or sinus tenderness  Throat:   Lips, mucosa, and tongue normal; teeth and gums normal  Neck:   Supple, symmetrical, trachea midline, no adenopathy;    thyroid:  no enlargement/tenderness/nodules; no carotid   bruit or JVD  Back:     Symmetric, no curvature, ROM normal, no CVA tenderness  Lungs:     Clear to auscultation bilaterally, respirations unlabored  Chest Wall:    No tenderness or deformity   Heart:    Regular rate and rhythm, S1 and S2 normal, no murmur, rub or gallop  Breast Exam:    Deferred  Abdomen:     Soft, non-tender, bowel sounds active all four quadrants,    no masses, no organomegaly  Genitalia:    Deferred  Extremities:   Extremities normal, atraumatic, no cyanosis or edema  Pulses:   2+ and symmetric all extremities  Skin:   Skin color, texture, turgor normal, no rashes or lesions  Lymph nodes:   Cervical, supraclavicular, and axillary nodes normal  Neurologic:   CNII-XII intact, normal strength, sensation and reflexes    throughout    Assessment/Plan:   Routine physical  examination Today patient counseled on age appropriate routine health concerns for screening and prevention, each reviewed and up to date or declined. Immunizations reviewed and up to date or declined. Labs ordered and reviewed. Risk factors for depression reviewed and negative. Hearing function and visual acuity are intact. ADLs screened and addressed as needed. Functional ability and level of safety reviewed and appropriate. Education, counseling and referrals performed based on assessed risks today. Patient provided with a copy of personalized plan for preventive services.  Pure hypercholesterolemia Update lipid panel and make recommendations accordingly  Obesity, unspecified classification, unspecified obesity type, unspecified whether  serious comorbidity present Continue efforts at healthy lifestyle  Cigarette nicotine dependence without complication She is not ready to quit at this time  Bipolar 1 disorder [facility]) Encouraged her to follow-up with her psychiatrist Denies SI/HI  I,Param Shah,acting as a Neurosurgeon for [employer], PA.,have documented all relevant documentation on the behalf of Jarold Motto, PA,as directed by  Jarold Motto, PA while in the presence of Jarold Motto, [place].  I, Jarold Motto, [place], have reviewed all documentation for this visit. The documentation on 10/24/22 for the exam, diagnosis, procedures, and orders are all accurate and complete.  Jarold Motto, PA-C [facility]

## 2022-10-24 NOTE — Patient Instructions (Addendum)
It was great to see you!  Please work on smoking reduction and getting back into your psychiatrist.   Please go to the lab for blood work.   Our office will call you with your results unless you have chosen to receive results via MyChart.  If your blood work is normal we will follow-up each year for physicals and as scheduled for chronic medical problems.  If anything is abnormal we will treat accordingly and get you in for a follow-up.  Take care,  Aldona Bar

## 2022-10-25 LAB — CBC WITH DIFFERENTIAL/PLATELET
Basophils Absolute: 0.2 10*3/uL — ABNORMAL HIGH (ref 0.0–0.1)
Basophils Relative: 1.5 % (ref 0.0–3.0)
Eosinophils Absolute: 0.1 10*3/uL (ref 0.0–0.7)
Eosinophils Relative: 1.2 % (ref 0.0–5.0)
HCT: 44.1 % (ref 36.0–46.0)
Hemoglobin: 14.6 g/dL (ref 12.0–15.0)
Lymphocytes Relative: 38 % (ref 12.0–46.0)
Lymphs Abs: 3.9 10*3/uL (ref 0.7–4.0)
MCHC: 33.2 g/dL (ref 30.0–36.0)
MCV: 88.8 fl (ref 78.0–100.0)
Monocytes Absolute: 0.6 10*3/uL (ref 0.1–1.0)
Monocytes Relative: 6.2 % (ref 3.0–12.0)
Neutro Abs: 5.5 10*3/uL (ref 1.4–7.7)
Neutrophils Relative %: 53.1 % (ref 43.0–77.0)
Platelets: 279 10*3/uL (ref 150.0–400.0)
RBC: 4.97 Mil/uL (ref 3.87–5.11)
RDW: 13.6 % (ref 11.5–15.5)
WBC: 10.3 10*3/uL (ref 4.0–10.5)

## 2022-10-25 LAB — HEMOGLOBIN A1C: Hgb A1c MFr Bld: 5.8 % (ref 4.6–6.5)

## 2022-10-25 LAB — LIPID PANEL
Cholesterol: 215 mg/dL — ABNORMAL HIGH (ref 0–200)
HDL: 41.6 mg/dL (ref >39.00)
NonHDL: 173.29
Total CHOL/HDL Ratio: 5
Triglycerides: 236 mg/dL — ABNORMAL HIGH (ref 0.0–149.0)
VLDL: 47.2 mg/dL — ABNORMAL HIGH (ref 0.0–40.0)

## 2022-10-25 LAB — COMPREHENSIVE METABOLIC PANEL
ALT: 18 U/L (ref 0–35)
AST: 16 U/L (ref 0–37)
Albumin: 4.7 g/dL (ref 3.5–5.2)
Alkaline Phosphatase: 69 U/L (ref 39–117)
BUN: 8 mg/dL (ref 6–23)
CO2: 30 mEq/L (ref 19–32)
Calcium: 10 mg/dL (ref 8.4–10.5)
Chloride: 103 mEq/L (ref 96–112)
Creatinine, Ser: 0.74 mg/dL (ref 0.40–1.20)
GFR: 108.28 mL/min (ref >60.00)
Glucose, Bld: 76 mg/dL (ref 70–99)
Potassium: 4.6 mEq/L (ref 3.5–5.1)
Sodium: 140 mEq/L (ref 135–145)
Total Bilirubin: 0.4 mg/dL (ref 0.2–1.2)
Total Protein: 7.4 g/dL (ref 6.0–8.3)

## 2022-10-25 LAB — LDL CHOLESTEROL, DIRECT: Direct LDL: 154 mg/dL

## 2022-12-26 ENCOUNTER — Ambulatory Visit (INDEPENDENT_AMBULATORY_CARE_PROVIDER_SITE_OTHER): Payer: Medicare Other

## 2022-12-26 VITALS — BP 118/68 | HR 79 | Temp 97.3°F | Wt 174.0 lb

## 2022-12-26 DIAGNOSIS — Z0000 Encounter for general adult medical examination without abnormal findings: Secondary | ICD-10-CM | POA: Diagnosis not present

## 2022-12-26 NOTE — Patient Instructions (Signed)
Danielle Ramsey , Thank you for taking time to come for your Medicare Wellness Visit. I appreciate your ongoing commitment to your health goals. Please review the following plan we discussed and let me know if I can assist you in the future.   These are the goals we discussed:  Goals   None     This is a list of the screening recommended for you and due dates:  Health Maintenance  Topic Date Due   Hepatitis C Screening: USPSTF Recommendation to screen - Ages 65-79 yo.  Never done   Medicare Annual Wellness Visit  04/12/2021   Pap Smear  01/15/2023   Flu Shot  10/25/2023*   DTaP/Tdap/Td vaccine (2 - Td or Tdap) 01/04/2030   HIV Screening  Completed   HPV Vaccine  Aged Out   COVID-19 Vaccine  Discontinued  *Topic was postponed. The date shown is not the original due date.    Advanced directives: Advance directive discussed with you today. Even though you declined this today please call our office should you change your mind and we can give you the proper paperwork for you to fill out.  Conditions/risks identified: stop smoking and lose weight   Next appointment: Follow up in one year for your annual wellness visit.   Preventive Care 54-35 Years Old, Female Preventive care refers to lifestyle choices and visits with your health care provider that can promote health and wellness. Preventive care visits are also called wellness exams. What can I expect for my preventive care visit? Counseling During your preventive care visit, your health care provider may ask about your: Medical history, including: Past medical problems. Family medical history. Pregnancy history. Current health, including: Menstrual cycle. Method of birth control. Emotional well-being. Home life and relationship well-being. Sexual activity and sexual health. Lifestyle, including: Alcohol, nicotine or tobacco, and drug use. Access to firearms. Diet, exercise, and sleep habits. Work and work Astronomer. Sunscreen  use. Safety issues such as seatbelt and bike helmet use. Physical exam Your health care provider may check your: Height and weight. These may be used to calculate your BMI (body mass index). BMI is a measurement that tells if you are at a healthy weight. Waist circumference. This measures the distance around your waistline. This measurement also tells if you are at a healthy weight and may help predict your risk of certain diseases, such as type 2 diabetes and high blood pressure. Heart rate and blood pressure. Body temperature. Skin for abnormal spots. What immunizations do I need? Vaccines are usually given at various ages, according to a schedule. Your health care provider will recommend vaccines for you based on your age, medical history, and lifestyle or other factors, such as travel or where you work. What tests do I need? Screening Your health care provider may recommend screening tests for certain conditions. This may include: Pelvic exam and Pap test. Lipid and cholesterol levels. Diabetes screening. This is done by checking your blood sugar (glucose) after you have not eaten for a while (fasting). Hepatitis B test. Hepatitis C test. HIV (human immunodeficiency virus) test. STI (sexually transmitted infection) testing, if you are at risk. BRCA-related cancer screening. This may be done if you have a family history of breast, ovarian, tubal, or peritoneal cancers. Talk with your health care provider about your test results, treatment options, and if necessary, the need for more tests. Follow these instructions at home: Eating and drinking  Eat a healthy diet that includes fresh fruits and vegetables, whole  grains, lean protein, and low-fat dairy products. Take vitamin and mineral supplements as recommended by your health care provider. Do not drink alcohol if: Your health care provider tells you not to drink. You are pregnant, may be pregnant, or are planning to become  pregnant. If you drink alcohol: Limit how much you have to 0-1 drink a day. Know how much alcohol is in your drink. In the [place] one drink equals one 12 oz bottle of beer (355 mL), one 5 oz glass of wine (148 mL), or one 1 oz glass of hard liquor (44 mL). Lifestyle Brush your teeth every morning and night with fluoride toothpaste. Floss one time each day. Exercise for at least 30 minutes 5 or more days each week. Do not use any products that contain nicotine or tobacco. These products include cigarettes, chewing tobacco, and vaping devices, such as e-cigarettes. If you need help quitting, ask your health care provider. Do not use drugs. If you are sexually active, practice safe sex. Use a condom or other form of protection to prevent STIs. If you do not wish to become pregnant, use a form of birth control. If you plan to become pregnant, see your health care provider for a prepregnancy visit. Find healthy ways to manage stress, such as: Meditation, yoga, or listening to music. Journaling. Talking to a trusted person. Spending time with friends and family. Minimize exposure to UV radiation to reduce your risk of skin cancer. Safety Always wear your seat belt while driving or riding in a vehicle. Do not drive: If you have been drinking alcohol. Do not ride with someone who has been drinking. If you have been using any mind-altering substances or drugs. While texting. When you are tired or distracted. Wear a helmet and other protective equipment during sports activities. If you have firearms in your house, make sure you follow all gun safety procedures. Seek help if you have been physically or sexually abused. What's next? Go to your health care provider once a year for an annual wellness visit. Ask your health care provider how often you should have your eyes and teeth checked. Stay up to date on all vaccines. This information is not intended to replace advice given to you by your  health care provider. Make sure you discuss any questions you have with your health care provider. Document Revised: 06/01/2021 Document Reviewed: 06/01/2021 Elsevier Patient Education  [place].

## 2022-12-26 NOTE — Progress Notes (Signed)
Subjective:   Danielle Ramsey is a 32 y.o. female who presents for Medicare Annual (Subsequent) preventive examination.  Review of Systems     Cardiac Risk Factors include: advanced age (>28men, >78 women);dyslipidemia;obesity (BMI >30kg/m2);smoking/ tobacco exposure     Objective:    Today's Vitals   12/26/22 1447  BP: 118/68  Pulse: 79  Temp: (!) 97.3 F (36.3 C)  SpO2: 98%  Weight: 174 lb (78.9 kg)   Body mass index is 33.98 kg/m.     12/26/2022    2:54 PM 04/12/2020    2:09 PM  Advanced Directives  Does Patient Have a Medical Advance Directive? No No  Would patient like information on creating a medical advance directive? No - Patient declined Yes (MAU/Ambulatory/Procedural Areas - Information given)    Current Medications (verified) Outpatient Encounter Medications as of 12/26/2022  Medication Sig   albuterol (PROAIR HFA) 108 (90 Base) MCG/ACT inhaler Inhale 2 puffs into the lungs every 6 (six) hours as needed for wheezing or shortness of breath.   Lurasidone HCl (LATUDA PO) Take by mouth daily.   montelukast (SINGULAIR) 10 MG tablet Take 10 mg by mouth daily.   No facility-administered encounter medications on file as of 12/26/2022.    Allergies (verified) Codeine   History: Past Medical History:  Diagnosis Date   Asthma    Depression    Ectopic pregnancy    Hyperlipidemia    Past Surgical History:  Procedure Laterality Date   APPENDECTOMY     Family History  Problem Relation Age of Onset   Diabetes Mother    Hypertension Mother    Early death Father 25   Stroke Paternal Grandmother    Colon cancer Neg Hx    Breast cancer Neg Hx    Social History   Socioeconomic History   Marital status: Single    Spouse name: Not on file   Number of children: Not on file   Years of education: Not on file   Highest education level: Not on file  Occupational History   Not on file  Tobacco Use   Smoking status: Every Day    Packs/day: 1.00    Types:  Cigarettes   Smokeless tobacco: Never  Substance and Sexual Activity   Alcohol use: No   Drug use: No   Sexual activity: Not on file  Other Topics Concern   Not on file  Social History Narrative   Currently on disability for bipolar type I disorder   Was hospitalized at age 89-18   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researcher, broadcasting/film/video as a Sports administrator of Corporate investment banker Strain: Low Risk  (12/26/2022)   Overall Financial Resource Strain (CARDIA)    Difficulty of Paying Living Expenses: Not hard at all  Food Insecurity: No Food Insecurity (12/26/2022)   Hunger Vital Sign    Worried About Running Out of Food in the Last Year: Never true    Ran Out of Food in the Last Year: Never true  Transportation Needs: No Transportation Needs (12/26/2022)   PRAPARE - Administrator, Civil Service (Medical): No    Lack of Transportation (Non-Medical): No  Physical Activity: Sufficiently Active (12/26/2022)   Exercise Vital Sign    Days of Exercise per Week: 3 days    Minutes of Exercise per Session: 60 min  Stress: No Stress Concern Present (12/26/2022)   [employer] of Occupational Health - Occupational Stress Questionnaire    Feeling of Stress :  Not at all  Social Connections: Moderately Integrated (12/26/2022)   Social Connection and Isolation Panel [NHANES]    Frequency of Communication with Friends and Family: More than three times a week    Frequency of Social Gatherings with Friends and Family: More than three times a week    Attends Religious Services: More than 4 times per year    Active Member of [employer] or Organizations: Yes    Attends Archivist Meetings: 1 to 4 times per year    Marital Status: Never married    Tobacco Counseling Ready to quit: Not Answered Counseling given: Not Answered   Clinical Intake:  Pre-visit preparation completed: Yes  Pain : No/denies pain     BMI - recorded: 33.98 Nutritional Status: BMI > 30  Obese Nutritional Risks:  None Diabetes: No  How often do you need to have someone help you when you read instructions, pamphlets, or other written materials from your doctor or pharmacy?: 1 - Never  Diabetic?no  Interpreter Needed?: No  Information entered by :: Charlott Rakes, LPN   Activities of Daily Living    12/26/2022    2:59 PM  In your present state of health, do you have any difficulty performing the following activities:  Hearing? 1  Comment slight loss with different sounds  Vision? 0  Difficulty concentrating or making decisions? 0  Walking or climbing stairs? 0  Dressing or bathing? 0  Doing errands, shopping? 0  Preparing Food and eating ? N  Using the Toilet? N  In the past six months, have you accidently leaked urine? Y  Comment at time pantyliner  Do you have problems with loss of bowel control? N  Managing your Medications? N  Managing your Finances? N  Housekeeping or managing your Housekeeping? N    Patient Care Team: Inda Coke, [place] as PCP - General (Physician Assistant) [place], [place] as Consulting Physician ([employer]) Arlan Organ, MD as Referring Physician (Obstetrics and Gynecology)  Indicate any recent Medical Services you may have received from other than [facility] providers in the past year (date may be approximate).     Assessment:   This is a routine wellness examination for Danielle Ramsey.  Hearing/Vision screen Hearing Screening - Comments:: Pt has slight hearing loss at times  Vision Screening - Comments:: Pt [facility] at [facility] for [facility] exams   Dietary issues and exercise activities discussed: Current Exercise Habits: Home exercise routine, Type of exercise: treadmill, Time (Minutes): 60, Frequency (Times/Week): 3, Weekly Exercise (Minutes/Week): 180   Goals Addressed             This Visit's Progress    Patient Stated       Stop smoking and lose weight        Depression Screen    12/26/2022     2:57 PM 10/24/2022    3:45 PM 04/12/2020    2:10 PM 01/05/2020    1:26 PM 06/13/2019    1:53 PM  PHQ 2/9 Scores  PHQ - 2 Score 0 2 3 4 2   PHQ- 9 Score  10 8 16 10     Fall Risk    12/26/2022    2:56 PM 04/12/2020    2:10 PM 06/13/2019    1:53 PM  [place] in the past year? 0 0 0  Number falls in past yr: 0 0 0  Injury with Fall? 0 0 0  Risk for fall  due to : Impaired vision No Fall Risks   Follow up Falls prevention discussed Falls evaluation completed;Education provided;Falls prevention discussed     FALL RISK PREVENTION PERTAINING TO THE HOME:  Any stairs in or around the home? No  If so, are there any without handrails? No  Home free of loose throw rugs in walkways, pet beds, electrical cords, etc? Yes  Adequate lighting in your home to reduce risk of falls? Yes   ASSISTIVE DEVICES UTILIZED TO PREVENT FALLS:  Life alert? No  Use of a cane, walker or w/c? No  Grab bars in the bathroom? Yes  Shower chair or bench in shower? No  Elevated toilet seat or a handicapped toilet? No   TIMED UP AND GO:  Was the test performed? Yes .  Length of time to ambulate 10 feet: 10 sec.   Gait steady and fast without use of assistive device  Cognitive Function:        12/26/2022    3:00 PM  6CIT Screen  What Year? 0 points  What month? 0 points  What time? 0 points  Count back from 20 0 points  Months in reverse 0 points  Repeat phrase 0 points  Total Score 0 points    Immunizations Immunization History  Administered Date(s) Administered   Tdap 01/05/2020    TDAP status: Up to date  Flu Vaccine status: Declined, Education has been provided regarding the importance of this vaccine but patient still declined. Advised may receive this vaccine at local pharmacy or Health Dept. Aware to provide a copy of the vaccination record if obtained from local pharmacy or Health Dept. Verbalized acceptance and understanding.    Covid-19 vaccine status: Declined, Education  has been provided regarding the importance of this vaccine but patient still declined. Advised may receive this vaccine at local pharmacy or Health Dept.or vaccine clinic. Aware to provide a copy of the vaccination record if obtained from local pharmacy or Health Dept. Verbalized acceptance and understanding.  Qualifies for Shingles Vaccine? No    Screening Tests Health Maintenance  Topic Date Due   Hepatitis C Screening  Never done   PAP SMEAR-Modifier  01/15/2023   INFLUENZA VACCINE  10/25/2023 (Originally 07/18/2022)   Medicare Annual Wellness (AWV)  12/27/2023   DTaP/Tdap/Td (2 - Td or Tdap) 01/04/2030   HIV Screening  Completed   HPV VACCINES  Aged Out   COVID-19 Vaccine  Discontinued    Health Maintenance  Health Maintenance Due  Topic Date Due   Hepatitis C Screening  Never done   PAP SMEAR-Modifier  01/15/2023      Additional Screening:  Hepatitis C Screening: does qualify  Vision Screening: Recommended annual ophthalmology exams for early detection of glaucoma and other disorders of the eye. Is the patient up to date with their annual eye exam?  Yes  Who is the provider or what is the name of the office in which the patient attends annual eye exams? [facility]  If pt is not established with a provider, would they like to be referred to a provider to establish care? No .   Dental Screening: Recommended annual dental exams for proper oral hygiene  Community Resource Referral / Chronic Care Management: CRR required this visit?  No   CCM required this visit?  No      Plan:     I have personally reviewed and noted the following in the patient's chart:   Medical and social history Use of alcohol,  tobacco or illicit drugs  Current medications and supplements including opioid prescriptions. Patient is not currently taking opioid prescriptions. Functional ability and status Nutritional status Physical activity Advanced directives List of other  physicians Hospitalizations, surgeries, and ER visits in previous 12 months Vitals Screenings to include cognitive, depression, and falls Referrals and appointments  In addition, I have reviewed and discussed with patient certain preventive protocols, quality metrics, and best practice recommendations. A written personalized care plan for preventive services as well as general preventive health recommendations were provided to patient.     Willette Brace, LPN   [id number]   Nurse Notes: none

## 2023-01-24 DIAGNOSIS — H5213 Myopia, bilateral: Secondary | ICD-10-CM | POA: Diagnosis not present

## 2023-03-23 ENCOUNTER — Encounter: Payer: Self-pay | Admitting: Physician Assistant

## 2023-03-26 ENCOUNTER — Telehealth: Payer: Self-pay | Admitting: Physician Assistant

## 2023-03-26 DIAGNOSIS — J302 Other seasonal allergic rhinitis: Secondary | ICD-10-CM

## 2023-03-26 MED ORDER — MONTELUKAST SODIUM 10 MG PO TABS: 10.0000 mg | ORAL_TABLET | Freq: Every day | ORAL | 1 refills | Status: AC

## 2023-03-26 MED ORDER — ALBUTEROL SULFATE HFA 108 (90 BASE) MCG/ACT IN AERS: 2.0000 | INHALATION_SPRAY | Freq: Four times a day (QID) | RESPIRATORY_TRACT | 2 refills | Status: AC | PRN

## 2023-03-26 NOTE — Telephone Encounter (Signed)
Spoke to pt told her Rx's for Albuterol inhaler and Singulair were sent to the pharmacy. Pt verbalized understanding.

## 2023-03-26 NOTE — Telephone Encounter (Signed)
Last OV: 10/24/22 (cpe) Next OV: 01/01/24 (AWV) Medication: Albuterol inhaler AND singulair 10 mg Pharmacy: [facility] on [address] in [place]  Patient would like additional refills with this as well.

## 2023-03-29 ENCOUNTER — Telehealth: Payer: Self-pay

## 2023-03-29 DIAGNOSIS — Z5329 Procedure and treatment not carried out because of patient's decision for other reasons: Secondary | ICD-10-CM | POA: Diagnosis not present

## 2023-03-29 DIAGNOSIS — R569 Unspecified convulsions: Secondary | ICD-10-CM | POA: Diagnosis not present

## 2023-03-29 DIAGNOSIS — Z885 Allergy status to narcotic agent status: Secondary | ICD-10-CM | POA: Diagnosis not present

## 2023-03-29 NOTE — Transitions of Care (Post Inpatient/ED Visit) (Signed)
   03/29/2023  Name: Danielle Ramsey MRN: [id number] DOB: Oct 14, 1991  Today's TOC FU Call Status:    Transition Care Management Follow-up Telephone Call Date of Discharge: 03/29/23 Discharge Facility: Other (Non-[facility] Facility) Name of Other ([facility]) Discharge Facility: [facility] Type of Discharge: Emergency Department Reason for ED Visit: Neurologic How have you been since you were released from the hospital?: Better Any questions or concerns?: No  Items Reviewed: Did you receive and understand the discharge instructions provided?: Yes Medications obtained and verified?: Yes (Medications Reviewed) Any new allergies since your discharge?: No Dietary orders reviewed?: NA Do you have support at home?: Yes  Home Care and Equipment/Supplies: Were Home Health Services Ordered?: NA Any new equipment or medical supplies ordered?: NA  Functional Questionnaire: Do you need assistance with bathing/showering or dressing?: No Do you need assistance with meal preparation?: No Do you need assistance with eating?: No Do you have difficulty maintaining continence: No Do you need assistance with getting out of bed/getting out of a chair/moving?: No Do you have difficulty managing or taking your medications?: No  Follow up appointments reviewed: PCP Follow-up appointment confirmed?: No MD Provider Line [phone] Given: Yes Specialist Hospital Follow-up appointment confirmed?: NA Do you need transportation to your follow-up appointment?: No Do you understand care options if your condition(s) worsen?: Yes-patient verbalized understanding  Patient states she is working out of town for the next couple weeks, will call back to schedule when she is available.    SIGNATURE Adela Glimpse, CMA

## 2023-05-15 ENCOUNTER — Ambulatory Visit (INDEPENDENT_AMBULATORY_CARE_PROVIDER_SITE_OTHER): Payer: 59 | Admitting: Physician Assistant

## 2023-05-15 ENCOUNTER — Other Ambulatory Visit: Payer: Self-pay | Admitting: Physician Assistant

## 2023-05-15 ENCOUNTER — Encounter: Payer: Self-pay | Admitting: Physician Assistant

## 2023-05-15 VITALS — BP 120/76 | HR 88 | Temp 98.0°F | Ht 60.0 in | Wt 177.0 lb

## 2023-05-15 DIAGNOSIS — L732 Hidradenitis suppurativa: Secondary | ICD-10-CM

## 2023-05-15 MED ORDER — CLINDAMYCIN PHOSPHATE 1 % EX GEL: Freq: Two times a day (BID) | CUTANEOUS | 0 refills | Status: AC

## 2023-05-15 MED ORDER — DOXYCYCLINE HYCLATE 100 MG PO TABS: 100.0000 mg | ORAL_TABLET | Freq: Two times a day (BID) | ORAL | 0 refills | Status: DC

## 2023-05-15 NOTE — Progress Notes (Signed)
Danielle Ramsey is a 32 y.o. female here for a new problem.  History of Present Illness:   Chief Complaint  Patient presents with   Abscess    Abscess    Recurrent skin infections Patient reports an area of tender swelling in her right inner groin for the past week or so Area is tender to palpation and has not drained Denies fever, chills, nausea, vomiting, malaise She has had this before usually in her groin She has never required any incision and drainage She has tried sitz bath's without any significant improvement  Past Medical History:  Diagnosis Date   Asthma    Depression    Ectopic pregnancy    Hyperlipidemia      Social History   Tobacco Use   Smoking status: Every Day    Packs/day: 1    Types: Cigarettes   Smokeless tobacco: Never  Substance Use Topics   Alcohol use: No   Drug use: No    Past Surgical History:  Procedure Laterality Date   APPENDECTOMY      Family History  Problem Relation Age of Onset   Diabetes Mother    Hypertension Mother    Early death Father 48   Stroke Paternal Grandmother    Colon cancer Neg Hx    Breast cancer Neg Hx     Allergies  Allergen Reactions   Codeine Other (See Comments)    Patient states she gets "loopy"    Current Medications:   Current Outpatient Medications:    albuterol (PROAIR HFA) 108 (90 Base) MCG/ACT inhaler, Inhale 2 puffs into the lungs every 6 (six) hours as needed for wheezing or shortness of breath., Disp: 18 g, Rfl: 2   clindamycin (CLINDAGEL) 1 % gel, Apply topically 2 (two) times daily., Disp: 30 g, Rfl: 0   doxycycline (VIBRA-TABS) 100 MG tablet, Take 1 tablet (100 mg total) by mouth 2 (two) times daily., Disp: 20 tablet, Rfl: 0   Lurasidone HCl (LATUDA PO), Take by mouth daily., Disp: , Rfl:    montelukast (SINGULAIR) 10 MG tablet, Take 1 tablet (10 mg total) by mouth daily., Disp: 90 tablet, Rfl: 1   Review of Systems:   ROS Negative unless otherwise specified per HPI.  Vitals:    Vitals:   05/15/23 1141  BP: 120/76  Pulse: 88  Temp: 98 F (36.7 C)  TempSrc: Temporal  SpO2: 98%  Weight: 177 lb (80.3 kg)  Height: 5' (1.524 m)     Body mass index is 34.57 kg/m.  Physical Exam:   Physical Exam Constitutional:      Appearance: Normal appearance. She is well-developed.  HENT:     Head: Normocephalic and atraumatic.  Eyes:     General: Lids are normal.     Extraocular Movements: Extraocular movements intact.     Conjunctiva/sclera: Conjunctivae normal.  Pulmonary:     Effort: Pulmonary effort is normal.  Musculoskeletal:        General: Normal range of motion.     Cervical back: Normal range of motion and neck supple.  Skin:    General: Skin is warm and dry.     Comments: Proximal aspect of right inner thigh with approximately 2 cm area of induration, no fluctuance.  Slight tenderness to palpation  Neurological:     Mental Status: She is alert and oriented to person, place, and time.  Psychiatric:        Attention and Perception: Attention and perception normal.  Mood and Affect: Mood normal.        Behavior: Behavior normal.        Thought Content: Thought content normal.        Judgment: Judgment normal.     Assessment and Plan:   Hidradenitis suppurativa No red flags on exam Suspect that patient has underlying hidradenitis suppurativa and I given her information on this today Areas not amenable to I&D Start oral doxycycline, patient denies chance of pregnancy I have also given her prescription for topical clindamycin to use on troublesome areas to prevent further episodes Consider dermatology referral for further maintenance Follow-up with [place] if area becomes infected and she develops systemic symptoms   Jarold Motto, PA-C

## 2023-05-23 ENCOUNTER — Ambulatory Visit: Payer: 59 | Admitting: Physician Assistant

## 2023-05-23 DIAGNOSIS — I499 Cardiac arrhythmia, unspecified: Secondary | ICD-10-CM | POA: Diagnosis not present

## 2023-05-23 DIAGNOSIS — Z743 Need for continuous supervision: Secondary | ICD-10-CM | POA: Diagnosis not present

## 2023-05-23 DIAGNOSIS — R410 Disorientation, unspecified: Secondary | ICD-10-CM | POA: Diagnosis not present

## 2023-05-23 DIAGNOSIS — R569 Unspecified convulsions: Secondary | ICD-10-CM | POA: Diagnosis not present

## 2023-05-23 DIAGNOSIS — R404 Transient alteration of awareness: Secondary | ICD-10-CM | POA: Diagnosis not present

## 2023-05-23 DIAGNOSIS — R519 Headache, unspecified: Secondary | ICD-10-CM | POA: Diagnosis not present

## 2023-05-25 ENCOUNTER — Ambulatory Visit (INDEPENDENT_AMBULATORY_CARE_PROVIDER_SITE_OTHER): Payer: 59 | Admitting: Physician Assistant

## 2023-05-25 VITALS — BP 130/82 | HR 96 | Temp 98.2°F | Ht 60.0 in | Wt 177.4 lb

## 2023-05-25 DIAGNOSIS — Z124 Encounter for screening for malignant neoplasm of cervix: Secondary | ICD-10-CM

## 2023-05-25 DIAGNOSIS — G40909 Epilepsy, unspecified, not intractable, without status epilepticus: Secondary | ICD-10-CM

## 2023-05-25 DIAGNOSIS — F319 Bipolar disorder, unspecified: Secondary | ICD-10-CM

## 2023-05-25 DIAGNOSIS — M25511 Pain in right shoulder: Secondary | ICD-10-CM

## 2023-05-25 LAB — URINALYSIS, ROUTINE W REFLEX MICROSCOPIC
Bilirubin Urine: NEGATIVE
Hgb urine dipstick: NEGATIVE
Ketones, ur: NEGATIVE
Leukocytes,Ua: NEGATIVE
Nitrite: NEGATIVE
Specific Gravity, Urine: 1.01 (ref 1.000–1.030)
Total Protein, Urine: NEGATIVE
Urine Glucose: NEGATIVE
Urobilinogen, UA: 0.2 (ref 0.0–1.0)
pH: 6 (ref 5.0–8.0)

## 2023-05-25 LAB — CBC WITH DIFFERENTIAL/PLATELET
Basophils Absolute: 0 10*3/uL (ref 0.0–0.1)
Basophils Relative: 0.4 % (ref 0.0–3.0)
Eosinophils Absolute: 0 10*3/uL (ref 0.0–0.7)
Eosinophils Relative: 0.5 % (ref 0.0–5.0)
HCT: 41.7 % (ref 36.0–46.0)
Hemoglobin: 13.9 g/dL (ref 12.0–15.0)
Lymphocytes Relative: 36.7 % (ref 12.0–46.0)
Lymphs Abs: 3.4 10*3/uL (ref 0.7–4.0)
MCHC: 33.4 g/dL (ref 30.0–36.0)
MCV: 87 fl (ref 78.0–100.0)
Monocytes Absolute: 0.6 10*3/uL (ref 0.1–1.0)
Monocytes Relative: 6 % (ref 3.0–12.0)
Neutro Abs: 5.2 10*3/uL (ref 1.4–7.7)
Neutrophils Relative %: 56.4 % (ref 43.0–77.0)
Platelets: 268 10*3/uL (ref 150.0–400.0)
RBC: 4.8 Mil/uL (ref 3.87–5.11)
RDW: 13.9 % (ref 11.5–15.5)
WBC: 9.2 10*3/uL (ref 4.0–10.5)

## 2023-05-25 NOTE — Patient Instructions (Signed)
It was great to see you!  Hold your Latuda for now Please follow-up with your prescriber about seizures so they are aware of this  Referral to gynecology placed today  Referral to neurology placed today [facility] Address: [address], [place], [place] [address] Phone: [phone]  If any recurrent seizures, go to the ER Do not drive until you are cleared by neuro   Take care,  Jarold Motto PA-C

## 2023-05-25 NOTE — Progress Notes (Addendum)
Danielle Ramsey is a 32 y.o. female here for a follow up of a pre-existing problem.  History of Present Illness:   Chief Complaint  Patient presents with   Follow-up    Pt here for ED f/u seen on 6/5 for seizure disorder. Pt has not had any since 6/5.   Shoulder Pain    Pt c/o pain right shoulder started yesterday.    HPI  Shoulder pain Patient is complaining about right shoulder pain starting yesterday. She states that the pain originates from her right shoulder blade and she is not taking any OTC products for it. Started after ER visit on 6/5  ED follow up Patient went to the ED on 6/5 for a seizure episode. Patient has prior history of seizures. She has not experienced another episode since. Patient is accompanied by her boyfriend today. He states that these episodes occur in the morning around 4-6 am when she is sleeping. He reports that the first episode was on April 11 and she was sent to the hospital.   Then on 6/5, her boyfriend was awoken by her convulsions at 4 am. He turned her on her side and opened her mouth to allow her to breathe. After the seizure, he made her stand up and made sure she was cognitively aware for a few hours. The patient eventually laid down as she was tired and a while later, she told her boyfriend she couldn't see. As he was making sure she was okay, he reports that one of her arms stood up in front of her and was stiffened. She started to convulse again when he called the ambulance. The convulsions usually last about 1-2 minutes. She confirms confusion, urinating on herself, and biting her tongue during these episodes. Denies any change in lifestyle. She is requesting a referral to a neurologist.  In ER she did have some hematuria and [place] blood cell count of 16.7.  She had a lumbar puncture as well.  This was overall normal except slightly elevated glucose.  Gynecology  Patient is requesting a referral to a gynecologist.  Bipolar disorder Patient  reports that she has not been taking her latuda po.  Past Medical History:  Diagnosis Date   Asthma    Depression    Ectopic pregnancy    Hyperlipidemia      Social History   Tobacco Use   Smoking status: Every Day    Packs/day: 1    Types: Cigarettes   Smokeless tobacco: Never  Substance Use Topics   Alcohol use: No   Drug use: No    Past Surgical History:  Procedure Laterality Date   APPENDECTOMY      Family History  Problem Relation Age of Onset   Diabetes Mother    Hypertension Mother    Early death Father 41   Stroke Paternal Grandmother    Colon cancer Neg Hx    Breast cancer Neg Hx     Allergies  Allergen Reactions   Codeine Other (See Comments)    Patient states she gets "loopy"    Current Medications:   Current Outpatient Medications:    albuterol (PROAIR HFA) 108 (90 Base) MCG/ACT inhaler, Inhale 2 puffs into the lungs every 6 (six) hours as needed for wheezing or shortness of breath., Disp: 18 g, Rfl: 2   clindamycin (CLINDAGEL) 1 % gel, Apply topically 2 (two) times daily., Disp: 30 g, Rfl: 0   doxycycline (VIBRA-TABS) 100 MG tablet, Take 1 tablet (100 mg total) by  mouth 2 (two) times daily., Disp: 20 tablet, Rfl: 0   Lurasidone HCl (LATUDA PO), Take by mouth daily., Disp: , Rfl:    montelukast (SINGULAIR) 10 MG tablet, Take 1 tablet (10 mg total) by mouth daily., Disp: 90 tablet, Rfl: 1   Review of Systems:   ROS Negative unless otherwise specified per HPI.   Vitals:   Vitals:   05/25/23 1030  BP: 130/82  Pulse: 96  Temp: 98.2 F (36.8 C)  TempSrc: Temporal  SpO2: 97%  Weight: 177 lb 6.1 oz (80.5 kg)  Height: 5' (1.524 m)     Body mass index is 34.64 kg/m.  Physical Exam:   Physical Exam Constitutional:      General: She is not in acute distress.    Appearance: Normal appearance. She is not ill-appearing.  HENT:     Head: Normocephalic and atraumatic.     Right Ear: External ear normal.     Left Ear: External ear normal.   Eyes:     Extraocular Movements: Extraocular movements intact.     Pupils: Pupils are equal, round, and reactive to light.  Cardiovascular:     Rate and Rhythm: Normal rate and regular rhythm.     Heart sounds: Normal heart sounds. No murmur heard.    No gallop.  Pulmonary:     Effort: Pulmonary effort is normal. No respiratory distress.     Breath sounds: Normal breath sounds. No wheezing or rales.  Musculoskeletal:     Comments: Normal ROM of right shoulder Tenderness to palpation to R scapular area  Skin:    General: Skin is warm and dry.  Neurological:     Mental Status: She is alert and oriented to person, place, and time.  Psychiatric:        Judgment: Judgment normal.     Assessment and Plan:   Seizure disorder [facility]) Urgent referral to neurology Advised her that she should not drive until cleared by neurology Will recheck her CBC and urine today Discussed that if she has any further seizure she needs to go to the emergency room I also asked her to reach out to her prescriber of Latuda to let them know that she is having seizures as I do not know if this could be related  Pap smear for cervical cancer screening Referral to gynecology  Acute pain of right shoulder No red flags I recommend that she trial ibuprofen If no improvement will obtain imaging versus send to sports medicine  Bipolar 1 disorder (HCC) I also asked her to reach out to her prescriber of Latuda to let them know that she is having seizures as I do not know if this could be related    I,Verona Buck,acting as a scribe for [employer], PA.,have documented all relevant documentation on the behalf of Jarold Motto, PA,as directed by  Jarold Motto, PA while in the presence of Jarold Motto, [place].   Jarold Motto, PA-C

## 2023-05-31 ENCOUNTER — Encounter: Payer: Self-pay | Admitting: Physician Assistant

## 2023-05-31 ENCOUNTER — Ambulatory Visit (INDEPENDENT_AMBULATORY_CARE_PROVIDER_SITE_OTHER): Payer: 59 | Admitting: Neurology

## 2023-05-31 ENCOUNTER — Encounter: Payer: Self-pay | Admitting: Neurology

## 2023-05-31 VITALS — BP 112/72 | HR 95 | Ht 60.0 in | Wt 178.0 lb

## 2023-05-31 DIAGNOSIS — G40909 Epilepsy, unspecified, not intractable, without status epilepticus: Secondary | ICD-10-CM | POA: Diagnosis not present

## 2023-05-31 DIAGNOSIS — Z5181 Encounter for therapeutic drug level monitoring: Secondary | ICD-10-CM

## 2023-05-31 MED ORDER — LAMOTRIGINE 25 MG PO TABS: ORAL_TABLET | ORAL | 0 refills | Status: DC

## 2023-05-31 MED ORDER — LAMOTRIGINE 100 MG PO TABS: 100.0000 mg | ORAL_TABLET | Freq: Two times a day (BID) | ORAL | 11 refills | Status: DC

## 2023-05-31 MED ORDER — LEVETIRACETAM 500 MG PO TABS: 500.0000 mg | ORAL_TABLET | Freq: Two times a day (BID) | ORAL | 0 refills | Status: DC

## 2023-05-31 NOTE — Patient Instructions (Addendum)
Start Keppra 500 mg twice daily for a month while we increase the Lamotrigine.  Start Lamotrigine as below  Week 1: 25mg  (one pill) at night  Week 2: 25mg  (one pill) twice daily Week 3: 50mg  (two pills) twice daily  Week 4: 75mg  (three pills) twice daily Week 5: 100mg  (fours pills) twice daily Will obtain a Lamotrigine level (blood test) and complete metabolic panel after week 6. Just walk in the office, no appointment needed.  Please stop the medication and call the office as soon as you develop a new rash Follow up in 6 months or sooner if worse

## 2023-05-31 NOTE — Progress Notes (Signed)
[facility]  PATIENT: Danielle Ramsey DOB: [birth year]  REQUESTING CLINICIAN: Jarold Motto, PA HISTORY FROM: Patient REASON FOR VISIT: New onset epilepsy    HISTORICAL  CHIEF COMPLAINT:  Chief Complaint  Patient presents with   New Patient (Initial Visit)    Rm 13, NP sz, new onset , happens during sleep, first sz was 4/10 grand mal, last sz was last week and had 2 in one day.    HISTORY OF PRESENT ILLNESS:  This is a 32 year old woman with past medical history of bipolar disorder, asthma who is presenting to establish care for her new onset epilepsy.  Patient reports since April 2024, she had a total of 3 seizures.  On April 13, after using [facility] she had her first seizure during her sleep.  This was a generalized convulsion with urinary incontinence and tongue biting.  Boyfriend witnessed the event and noted that she was confused afterward.  She was taken to the hospital, initial workup was negative and she was recommended to follow-up with her PCP.  On June 5 she had 2 seizures, first one at 5 AM out of sleep and 3 hours later she had a second seizure.  After the second seizure she was also taken to the hospital at that time she did have a head CT which was negative, she did have an LP to rule out infection which was also unrevealing.  She was not started on medication at that time but was referred to neurology and PCP. Since June 5 she has been doing well has not had any additional seizures.  She denies any previous history of seizures, denies any previous history of head injury.   Handedness: Right handed   Onset: April 2024  Seizure Type: Generalized convulsion   Current frequency: 2 seizures   Any injuries from seizures: Larey Seat off bed, tongue biting, right shoulder pain   Seizure risk factors: Mother in the setting of having a stroke  Previous ASMs: None   Currenty ASMs: None   ASMs side effects: N/A   Brain Images: Normal Head CT   Previous EEGs:  None done    OTHER MEDICAL CONDITIONS: Bipolar Disorder, Asthma   REVIEW OF SYSTEMS: Full 14 system review of systems performed and negative with exception of: As noted in the HPI   ALLERGIES: Allergies  Allergen Reactions   Codeine Other (See Comments)    Patient states she gets "loopy"    HOME MEDICATIONS: Outpatient Medications Prior to Visit  Medication Sig Dispense Refill   albuterol (PROAIR HFA) 108 (90 Base) MCG/ACT inhaler Inhale 2 puffs into the lungs every 6 (six) hours as needed for wheezing or shortness of breath. 18 g 2   clindamycin (CLINDAGEL) 1 % gel Apply topically 2 (two) times daily. 30 g 0   ibuprofen (ADVIL) 200 MG tablet Take 400 mg by mouth every 6 (six) hours as needed.     montelukast (SINGULAIR) 10 MG tablet Take 1 tablet (10 mg total) by mouth daily. 90 tablet 1   doxycycline (VIBRA-TABS) 100 MG tablet Take 1 tablet (100 mg total) by mouth 2 (two) times daily. (Patient not taking: Reported on 05/31/2023) 20 tablet 0   Lurasidone HCl (LATUDA PO) Take by mouth daily. (Patient not taking: Reported on 05/31/2023)     No facility-administered medications prior to visit.    PAST MEDICAL HISTORY: Past Medical History:  Diagnosis Date   Asthma    Depression    Ectopic pregnancy    Hyperlipidemia  Seizures (HCC)     PAST SURGICAL HISTORY: Past Surgical History:  Procedure Laterality Date   APPENDECTOMY      FAMILY HISTORY: Family History  Problem Relation Age of Onset   Diabetes Mother    Hypertension Mother    Hyperlipidemia Mother    Early death Father 20   Stroke Paternal Grandmother    Colon cancer Neg Hx    Breast cancer Neg Hx     SOCIAL HISTORY: Social History   Socioeconomic History   Marital status: Single    Spouse name: Not on file   Number of children: Not on file   Years of education: Not on file   Highest education level: Some college, no degree  Occupational History   Not on file  Tobacco Use   Smoking status: Every  Day    Packs/day: .5    Types: Cigarettes   Smokeless tobacco: Never  Vaping Use   Vaping Use: Never used  Substance and Sexual Activity   Alcohol use: No   Drug use: No    Comment: and then smoked it agian and having seizure   Sexual activity: Yes  Other Topics Concern   Not on file  Social History Narrative   Currently on disability for bipolar type I disorder   Was hospitalized at age 6-18   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[employer], broadcasting/film/video as a Production designer, theatre/television/film   Social Determinants of Health   Financial Resource Strain: Patient Declined (05/25/2023)   Overall Financial Resource Strain (CARDIA)    Difficulty of Paying Living Expenses: Patient declined  Food Insecurity: Unknown (05/25/2023)   Hunger Vital Sign    Worried About Running Out of Food in the Last Year: Never true    Ran Out of Food in the Last Year: Patient declined  Transportation Needs: No Transportation Needs (05/25/2023)   PRAPARE - Administrator, Civil Service (Medical): No    Lack of Transportation (Non-Medical): No  Physical Activity: Sufficiently Active (05/25/2023)   Exercise Vital Sign    Days of Exercise per Week: 4 days    Minutes of Exercise per Session: 40 min  Stress: Stress Concern Present (05/25/2023)   [employer] of Occupational Health - Occupational Stress Questionnaire    Feeling of Stress : To some extent  Social Connections: Socially Integrated (05/25/2023)   Social Connection and Isolation Panel [NHANES]    Frequency of Communication with Friends and Family: More than three times a week    Frequency of Social Gatherings with Friends and Family: Twice a week    Attends Religious Services: More than 4 times per year    Active Member of [employer] or Organizations: Yes    Attends Banker Meetings: Patient declined    Marital Status: Living with partner  Intimate Partner Violence: Not At Risk (12/26/2022)   Humiliation, Afraid, Rape, and Kick questionnaire    Fear of Current or Ex-Partner: No    Emotionally  Abused: No    Physically Abused: No    Sexually Abused: No    PHYSICAL EXAM  GENERAL EXAM/CONSTITUTIONAL: Vitals:  Vitals:   05/31/23 1505  BP: 112/72  Pulse: 95  SpO2: 96%  Weight: 178 lb (80.7 kg)  Height: 5' (1.524 m)   Body mass index is 34.76 kg/m. Wt Readings from Last 3 Encounters:  05/31/23 178 lb (80.7 kg)  05/25/23 177 lb 6.1 oz (80.5 kg)  05/15/23 177 lb (80.3 kg)   Patient is in no distress; well developed, nourished and  groomed; neck is supple  MUSCULOSKELETAL: Gait, strength, tone, movements noted in Neurologic exam below  NEUROLOGIC: MENTAL STATUS:      No data to display         awake, alert, oriented to person, place and time recent and remote memory intact normal attention and concentration language fluent, comprehension intact, naming intact fund of knowledge appropriate  CRANIAL NERVE:  2nd, 3rd, 4th, 6th - Visual fields full to confrontation, extraocular muscles intact, no nystagmus 5th - facial sensation symmetric 7th - facial strength symmetric 8th - hearing intact 9th - palate elevates symmetrically, uvula midline 11th - shoulder shrug symmetric 12th - tongue protrusion midline  MOTOR:  normal bulk and tone, full strength in the BUE, BLE  SENSORY:  normal and symmetric to light touch  COORDINATION:  finger-nose-finger, fine finger movements normal  GAIT/STATION:  normal   DIAGNOSTIC DATA (LABS, IMAGING, TESTING) - I reviewed patient records, labs, notes, testing and imaging myself where available.  Lab Results  Component Value Date   WBC 9.2 05/25/2023   HGB 13.9 05/25/2023   HCT 41.7 05/25/2023   MCV 87.0 05/25/2023   PLT 268.0 05/25/2023      Component Value Date/Time   NA 140 10/24/2022 1552   K 4.6 10/24/2022 1552   CL 103 10/24/2022 1552   CO2 30 10/24/2022 1552   GLUCOSE 76 10/24/2022 1552   BUN 8 10/24/2022 1552   CREATININE 0.74 10/24/2022 1552   CALCIUM 10.0 10/24/2022 1552   PROT 7.4 10/24/2022  1552   ALBUMIN 4.7 10/24/2022 1552   AST 16 10/24/2022 1552   ALT 18 10/24/2022 1552   ALKPHOS 69 10/24/2022 1552   BILITOT 0.4 10/24/2022 1552   Lab Results  Component Value Date   CHOL 215 (H) 10/24/2022   HDL 41.60 10/24/2022   LDLCALC 153 (H) 01/05/2020   LDLDIRECT 154.0 10/24/2022   TRIG 236.0 (H) 10/24/2022   Lab Results  Component Value Date   HGBA1C 5.8 10/24/2022   No results found for: "VITAMINB12" Lab Results  Component Value Date   TSH 1.84 07/09/2019    Head CT 05/23/2023 Normal study.    ASSESSMENT AND PLAN  32 y.o. year old female  with history of bipolar disorder, asthma who is presenting for her new diagnosis of epilepsy.  Patient did have a total of 3 seizures that are at least 24 hours apart meeting the diagnosis criteria for epilepsy.  She does have a history of bipolar disorder therefore I would like to start her on lamotrigine, initially 25 mg daily with a goal of 100 mg twice daily.  To protect her during this time of titration of the lamotrigine, I will also put her on Keppra 500 mg twice daily but only for 1 month.  Patient understands to take Keppra 500 mg twice daily for 1 month then discontinue the medication.  She also understands to continue to taper of lamotrigine as discussed, goal of 100 mg twice daily.  In about 6 to 7 weeks, she will walk into the office for blood work. She understands to contact me if she has any breakthrough seizure or any side effect from the medication.  We also discussed driving restriction for the next 6 months and she voices understanding. I will see her in 6 months for follow-up.     1. Nonintractable epilepsy without status epilepticus, unspecified epilepsy type (HCC)   2. Therapeutic drug monitoring     Patient Instructions  Start Keppra 500 mg twice  daily for a month while we increase the Lamotrigine.  Start Lamotrigine as below  Week 1: 25mg  (one pill) at night  Week 2: 25mg  (one pill) twice daily Week 3: 50mg   (two pills) twice daily  Week 4: 75mg  (three pills) twice daily Week 5: 100mg  (fours pills) twice daily Will obtain a Lamotrigine level (blood test) and complete metabolic panel after week 6. Just walk in the office, no appointment needed.  Please stop the medication and call the office as soon as you develop a new rash Follow up in 6 months or sooner if worse      Per [facility] statutes, patients with seizures are not allowed to drive until they have been seizure-free for six months.  Other recommendations include using caution when using heavy equipment or power tools. Avoid working on ladders or at heights. Take showers instead of baths.  Do not swim alone.  Ensure the water temperature is not too high on the home water heater. Do not go swimming alone. Do not lock yourself in a room alone (i.e. bathroom). When caring for infants or small children, sit down when holding, feeding, or changing them to minimize risk of injury to the child in the event you have a seizure. Maintain good sleep hygiene. Avoid alcohol.  Also recommend adequate sleep, hydration, good diet and minimize stress.   During the Seizure  - First, ensure adequate ventilation and place patients on the floor on their left side  Loosen clothing around the neck and ensure the airway is patent. If the patient is clenching the teeth, do not force the mouth open with any object as this can cause severe damage - Remove all items from the surrounding that can be hazardous. The patient may be oblivious to what's happening and may not even know what he or she is doing. If the patient is confused and wandering, either gently guide him/her away and block access to outside areas - Reassure the individual and be comforting - Call 911. In most cases, the seizure ends before EMS arrives. However, there are cases when seizures may last over 3 to 5 minutes. Or the individual may have developed breathing difficulties or severe injuries.  If a pregnant patient or a person with diabetes develops a seizure, it is prudent to call an ambulance. - Finally, if the patient does not regain full consciousness, then call EMS. Most patients will remain confused for about 45 to 90 minutes after a seizure, so you must use judgment in calling for help. - Avoid restraints but make sure the patient is in a bed with padded side rails - Place the individual in a lateral position with the neck slightly flexed; this will help the saliva drain from the mouth and prevent the tongue from falling backward - Remove all nearby furniture and other hazards from the area - Provide verbal assurance as the individual is regaining consciousness - Provide the patient with privacy if possible - Call for help and start treatment as ordered by the caregiver   After the Seizure (Postictal Stage)  After a seizure, most patients experience confusion, fatigue, muscle pain and/or a headache. Thus, one should permit the individual to sleep. For the next few days, reassurance is essential. Being calm and helping reorient the person is also of importance.  Most seizures are painless and end spontaneously. Seizures are not harmful to others but can lead to complications such as stress on the lungs, brain and the heart. Individuals with  prior lung problems may develop labored breathing and respiratory distress.     Orders Placed This Encounter  Procedures   Lamotrigine level   CMP   EEG adult    Meds ordered this encounter  Medications   levETIRAcetam (KEPPRA) 500 MG tablet    Sig: Take 1 tablet (500 mg total) by mouth 2 (two) times daily.    Dispense:  60 tablet    Refill:  0   lamoTRIgine (LAMICTAL) 25 MG tablet    Sig: Take 1 tablet (25 mg total) by mouth daily for 7 days, THEN 1 tablet (25 mg total) 2 (two) times daily for 7 days, THEN 2 tablets (50 mg total) 2 (two) times daily for 7 days, THEN 3 tablets (75 mg total) 2 (two) times daily for 7 days, THEN 4  tablets (100 mg total) 2 (two) times daily for 7 days.    Dispense:  147 tablet    Refill:  0   lamoTRIgine (LAMICTAL) 100 MG tablet    Sig: Take 1 tablet (100 mg total) by mouth 2 (two) times daily.    Dispense:  60 tablet    Refill:  11    PLEASE DO NOT DISPENSE PRIOR TO JULY 15.    Return in about 6 months (around 11/30/2023).    Windell Norfolk, MD 05/31/2023, 5:09 PM  [facility] [address] [place], [place] [address] [phone]

## 2023-06-06 ENCOUNTER — Ambulatory Visit (INDEPENDENT_AMBULATORY_CARE_PROVIDER_SITE_OTHER): Payer: 59 | Admitting: Neurology

## 2023-06-06 DIAGNOSIS — G40909 Epilepsy, unspecified, not intractable, without status epilepticus: Secondary | ICD-10-CM | POA: Diagnosis not present

## 2023-06-06 NOTE — Procedures (Signed)
    History:  32 year old woman with epilepsy   EEG classification:  Awake and asleep  Description of the recording: The background rhythms of this recording consists of a fairly well modulated medium amplitude background activity of 12 Hz. As the record progresses, the patient initially is in the waking state, but appears to enter the early stage II sleep during the recording, with rudimentary sleep spindles and vertex sharp wave activity seen. During the wakeful state, photic stimulation is performed, and no abnormal responses were seen. Hyperventilation was also performed, no abnormal response seen. No epileptiform discharges seen during this recording. There was no focal slowing.   Abnormality: None   Impression: This is a normal EEG recording in the waking and sleeping state. No evidence of interictal epileptiform discharges. Normal EEGs, however, do not rule out epilepsy.    Windell Norfolk, MD [facility]

## 2023-12-27 ENCOUNTER — Encounter: Payer: Self-pay | Admitting: Neurology

## 2023-12-27 ENCOUNTER — Ambulatory Visit (INDEPENDENT_AMBULATORY_CARE_PROVIDER_SITE_OTHER): Payer: 59 | Admitting: Neurology

## 2023-12-27 DIAGNOSIS — G40909 Epilepsy, unspecified, not intractable, without status epilepticus: Secondary | ICD-10-CM | POA: Diagnosis not present

## 2023-12-27 DIAGNOSIS — Z5181 Encounter for therapeutic drug level monitoring: Secondary | ICD-10-CM | POA: Diagnosis not present

## 2023-12-27 MED ORDER — LAMOTRIGINE 150 MG PO TABS: 150.0000 mg | ORAL_TABLET | Freq: Two times a day (BID) | ORAL | 3 refills | Status: DC

## 2023-12-27 MED ORDER — VALTOCO 15 MG DOSE 7.5 MG/0.1ML NA LQPK: 15.0000 mg | NASAL | 0 refills | Status: DC | PRN

## 2023-12-27 NOTE — Progress Notes (Signed)
 [facility]  PATIENT: Danielle Ramsey DOB: [birth year]  REQUESTING CLINICIAN: Job Lukes, PA HISTORY FROM: Patient REASON FOR VISIT: New onset epilepsy    HISTORICAL  CHIEF COMPLAINT:  Chief Complaint  Patient presents with   Seizures    Rm13, mother present, Sz: last episode was in October before halloween, no concerns to report   INTERVAL HISTORY 12/27/2023:  Patient presents today for follow-up, she is accompanied by her mother.  Last visit was in June, and at that time we started her on lamotrigine  and obtained a routine EEG.  Routine EEG was normal, no epileptiform discharges, and no abnormality.  She tells me she was doing well until the week before halloween when she did have a breakthrough seizure.  She reports compliance with her medication, denies any provoking factor.  Seizure was witnessed by significant other, denies any injury from the seizure.  Since then she has been doing well.  No other complaint no other concerns.   HISTORY OF PRESENT ILLNESS:  This is a 33 year old woman with past medical history of bipolar disorder, asthma who is presenting to establish care for her new onset epilepsy.  Patient reports since April 2024, she had a total of 3 seizures.  On April 13, after using [facility] she had her first seizure during her sleep.  This was a generalized convulsion with urinary incontinence and tongue biting.  Boyfriend witnessed the event and noted that she was confused afterward.  She was taken to the hospital, initial workup was negative and she was recommended to follow-up with her PCP.  On June 5 she had 2 seizures, first one at 5 AM out of sleep and 3 hours later she had a second seizure.  After the second seizure she was also taken to the hospital at that time she did have a head CT which was negative, she did have an LP to rule out infection which was also unrevealing.  She was not started on medication at that time but was referred to neurology and  PCP. Since June 5 she has been doing well has not had any additional seizures.  She denies any previous history of seizures, denies any previous history of head injury.   Handedness: Right handed   Onset: April 2024  Seizure Type: Generalized convulsion   Current frequency: 2 seizures   Any injuries from seizures: Clemens off bed, tongue biting, right shoulder pain   Seizure risk factors: Mother in the setting of having a stroke  Previous ASMs: None   Currenty ASMs: Lamotrigine  100  ASMs side effects: Denies   Brain Images: Normal Head CT   Previous EEGs: Normal routine    OTHER MEDICAL CONDITIONS: Bipolar Disorder, Asthma   REVIEW OF SYSTEMS: Full 14 system review of systems performed and negative with exception of: As noted in the HPI   ALLERGIES: Allergies  Allergen Reactions   Codeine Other (See Comments)    Patient states she gets loopy    HOME MEDICATIONS: Outpatient Medications Prior to Visit  Medication Sig Dispense Refill   albuterol  (PROAIR  HFA) 108 (90 Base) MCG/ACT inhaler Inhale 2 puffs into the lungs every 6 (six) hours as needed for wheezing or shortness of breath. 18 g 2   clindamycin  (CLINDAGEL) 1 % gel Apply topically 2 (two) times daily. 30 g 0   Lurasidone HCl (LATUDA PO) Take by mouth daily.     montelukast  (SINGULAIR ) 10 MG tablet Take 1 tablet (10 mg total) by mouth daily. 90 tablet 1  lamoTRIgine  (LAMICTAL ) 100 MG tablet Take 1 tablet (100 mg total) by mouth 2 (two) times daily. 60 tablet 11   doxycycline  (VIBRA -TABS) 100 MG tablet Take 1 tablet (100 mg total) by mouth 2 (two) times daily. (Patient not taking: Reported on 05/31/2023) 20 tablet 0   ibuprofen (ADVIL) 200 MG tablet Take 400 mg by mouth every 6 (six) hours as needed.     lamoTRIgine  (LAMICTAL ) 25 MG tablet Take 1 tablet (25 mg total) by mouth daily for 7 days, THEN 1 tablet (25 mg total) 2 (two) times daily for 7 days, THEN 2 tablets (50 mg total) 2 (two) times daily for 7 days, THEN  3 tablets (75 mg total) 2 (two) times daily for 7 days, THEN 4 tablets (100 mg total) 2 (two) times daily for 7 days. 147 tablet 0   levETIRAcetam  (KEPPRA ) 500 MG tablet Take 1 tablet (500 mg total) by mouth 2 (two) times daily. 60 tablet 0   No facility-administered medications prior to visit.    PAST MEDICAL HISTORY: Past Medical History:  Diagnosis Date   Asthma    Depression    Ectopic pregnancy    Hyperlipidemia    Seizures (HCC)     PAST SURGICAL HISTORY: Past Surgical History:  Procedure Laterality Date   APPENDECTOMY      FAMILY HISTORY: Family History  Problem Relation Age of Onset   Diabetes Mother    Hypertension Mother    Hyperlipidemia Mother    Early death Father 6   Stroke Paternal Grandmother    Colon cancer Neg Hx    Breast cancer Neg Hx     SOCIAL HISTORY: Social History   Socioeconomic History   Marital status: Single    Spouse name: Not on file   Number of children: Not on file   Years of education: Not on file   Highest education level: Some college, no degree  Occupational History   Not on file  Tobacco Use   Smoking status: Every Day    Current packs/day: 0.50    Types: Cigarettes   Smokeless tobacco: Never  Vaping Use   Vaping status: Never Used  Substance and Sexual Activity   Alcohol  use: No   Drug use: No    Comment: and then smoked it agian and having seizure   Sexual activity: Yes  Other Topics Concern   Not on file  Social History Narrative   Currently on disability for bipolar type I disorder   Was hospitalized at age 2-18   Works at programme researcher, broadcasting/film/video as a production designer, theatre/television/film   Social Drivers of Health   Financial Resource Strain: Patient Declined (05/25/2023)   Overall Financial Resource Strain (CARDIA)    Difficulty of Paying Living Expenses: Patient declined  Food Insecurity: Unknown (05/25/2023)   Hunger Vital Sign    Worried About Running Out of Food in the Last Year: Never true    Ran Out of Food in the Last Year: Patient  declined  Transportation Needs: No Transportation Needs (05/25/2023)   PRAPARE - Administrator, Civil Service (Medical): No    Lack of Transportation (Non-Medical): No  Physical Activity: Sufficiently Active (05/25/2023)   Exercise Vital Sign    Days of Exercise per Week: 4 days    Minutes of Exercise per Session: 40 min  Stress: Stress Concern Present (05/25/2023)   [employer] of Occupational Health - Occupational Stress Questionnaire    Feeling of Stress : To some extent  Social Connections:  Socially Integrated (05/25/2023)   Social Connection and Isolation Panel [NHANES]    Frequency of Communication with Friends and Family: More than three times a week    Frequency of Social Gatherings with Friends and Family: Twice a week    Attends Religious Services: More than 4 times per year    Active Member of [employer] or Organizations: Yes    Attends Banker Meetings: Patient declined    Marital Status: Living with partner  Intimate Partner Violence: Not At Risk (12/26/2022)   Humiliation, Afraid, Rape, and Kick questionnaire    Fear of Current or Ex-Partner: No    Emotionally Abused: No    Physically Abused: No    Sexually Abused: No    PHYSICAL EXAM  GENERAL EXAM/CONSTITUTIONAL: Vitals:  Vitals:   12/27/23 1535  BP: (!) 138/93  Pulse: 94  Weight: 185 lb 8 oz (84.1 kg)  Height: 5' (1.524 m)   Body mass index is 36.23 kg/m. Wt Readings from Last 3 Encounters:  12/27/23 185 lb 8 oz (84.1 kg)  05/31/23 178 lb (80.7 kg)  05/25/23 177 lb 6.1 oz (80.5 kg)   Patient is in no distress; well developed, nourished and groomed; neck is supple  MUSCULOSKELETAL: Gait, strength, tone, movements noted in Neurologic exam below  NEUROLOGIC: MENTAL STATUS:      No data to display         awake, alert, oriented to person, place and time recent and remote memory intact normal attention and concentration language fluent, comprehension intact, naming intact fund  of knowledge appropriate  CRANIAL NERVE:  2nd, 3rd, 4th, 6th - Visual fields full to confrontation, extraocular muscles intact, no nystagmus 5th - facial sensation symmetric 7th - facial strength symmetric 8th - hearing intact 9th - palate elevates symmetrically, uvula midline 11th - shoulder shrug symmetric 12th - tongue protrusion midline  MOTOR:  normal bulk and tone, full strength in the BUE, BLE  SENSORY:  normal and symmetric to light touch  COORDINATION:  finger-nose-finger, fine finger movements normal  GAIT/STATION:  normal   DIAGNOSTIC DATA (LABS, IMAGING, TESTING) - I reviewed patient records, labs, notes, testing and imaging myself where available.  Lab Results  Component Value Date   WBC 9.2 05/25/2023   HGB 13.9 05/25/2023   HCT 41.7 05/25/2023   MCV 87.0 05/25/2023   PLT 268.0 05/25/2023      Component Value Date/Time   NA 140 10/24/2022 1552   K 4.6 10/24/2022 1552   CL 103 10/24/2022 1552   CO2 30 10/24/2022 1552   GLUCOSE 76 10/24/2022 1552   BUN 8 10/24/2022 1552   CREATININE 0.74 10/24/2022 1552   CALCIUM 10.0 10/24/2022 1552   PROT 7.4 10/24/2022 1552   ALBUMIN 4.7 10/24/2022 1552   AST 16 10/24/2022 1552   ALT 18 10/24/2022 1552   ALKPHOS 69 10/24/2022 1552   BILITOT 0.4 10/24/2022 1552   Lab Results  Component Value Date   CHOL 215 (H) 10/24/2022   HDL 41.60 10/24/2022   LDLCALC 153 (H) 01/05/2020   LDLDIRECT 154.0 10/24/2022   TRIG 236.0 (H) 10/24/2022   Lab Results  Component Value Date   HGBA1C 5.8 10/24/2022   No results found for: [id number] Lab Results  Component Value Date   TSH 1.84 07/09/2019    Head CT 05/23/2023 Normal study.   EEG 06/05/2024 This is a normal EEG recording in the waking and sleeping state. No evidence of interictal epileptiform discharges. Normal EEGs, however, do not  rule out epileps    ASSESSMENT AND PLAN  33 y.o. year old female  with history of bipolar disorder, asthma and epilepsy who  is presenting for follow-up.  She was on lamotrigine  100 mg twice daily but did have a breakthrough seizure the week before Halloween.  Plan will be to increase the lamotrigine  to 150 mg twice daily and give a rescue medication.  Will check lamotrigine  level and CMP.  I will see patient in 6 months for follow-up or sooner if worse.  Advised her to contact us  if she does have a breakthrough seizure or any other questions or concerns.  Both patient and mother are comfortable with plans.   1. Nonintractable epilepsy without status epilepticus, unspecified epilepsy type (HCC)   2. Therapeutic drug monitoring      Patient Instructions  Increase Lamotrigine  to 150 mg twice daily  Will check Lamotrigine  level with CMP Will give Valtoco  as rescue medication  Return in 6 months or sooner if worse    Per [place] statutes, patients with seizures are not allowed to drive until they have been seizure-free for six months.  Other recommendations include using caution when using heavy equipment or power tools. Avoid working on ladders or at heights. Take showers instead of baths.  Do not swim alone.  Ensure the water temperature is not too high on the home water heater. Do not go swimming alone. Do not lock yourself in a room alone (i.e. bathroom). When caring for infants or small children, sit down when holding, feeding, or changing them to minimize risk of injury to the child in the event you have a seizure. Maintain good sleep hygiene. Avoid alcohol .  Also recommend adequate sleep, hydration, good diet and minimize stress.   During the Seizure  - First, ensure adequate ventilation and place patients on the floor on their left side  Loosen clothing around the neck and ensure the airway is patent. If the patient is clenching the teeth, do not force the mouth open with any object as this can cause severe damage - Remove all items from the surrounding that can be hazardous. The patient may be  oblivious to what's happening and may not even know what he or she is doing. If the patient is confused and wandering, either gently guide him/her away and block access to outside areas - Reassure the individual and be comforting - Call 911. In most cases, the seizure ends before EMS arrives. However, there are cases when seizures may last over 3 to 5 minutes. Or the individual may have developed breathing difficulties or severe injuries. If a pregnant patient or a person with diabetes develops a seizure, it is prudent to call an ambulance. - Finally, if the patient does not regain full consciousness, then call EMS. Most patients will remain confused for about 45 to 90 minutes after a seizure, so you must use judgment in calling for help. - Avoid restraints but make sure the patient is in a bed with padded side rails - Place the individual in a lateral position with the neck slightly flexed; this will help the saliva drain from the mouth and prevent the tongue from falling backward - Remove all nearby furniture and other hazards from the area - Provide verbal assurance as the individual is regaining consciousness - Provide the patient with privacy if possible - Call for help and start treatment as ordered by the caregiver   After the Seizure (Postictal Stage)  After a seizure,  most patients experience confusion, fatigue, muscle pain and/or a headache. Thus, one should permit the individual to sleep. For the next few days, reassurance is essential. Being calm and helping reorient the person is also of importance.  Most seizures are painless and end spontaneously. Seizures are not harmful to others but can lead to complications such as stress on the lungs, brain and the heart. Individuals with prior lung problems may develop labored breathing and respiratory distress.     No orders of the defined types were placed in this encounter.   Meds ordered this encounter  Medications   lamoTRIgine   (LAMICTAL ) 150 MG tablet    Sig: Take 1 tablet (150 mg total) by mouth 2 (two) times daily.    Dispense:  180 tablet    Refill:  3    PLEASE DO NOT DISPENSE PRIOR TO JULY 15.   diazePAM , 15 MG Dose, (VALTOCO  15 MG DOSE) 2 x 7.5 MG/0.1ML LQPK    Sig: Place 15 mg into the nose as needed (For seizure lasting more than 5 minutes).    Dispense:  2 each    Refill:  0    Return in about 6 months (around 06/25/2024).    Pastor Falling, MD 12/27/2023, 4:04 PM  [facility] [address], [address] [place], [place] [address] [phone]

## 2023-12-27 NOTE — Patient Instructions (Signed)
 Increase Lamotrigine to 150 mg twice daily  Will check Lamotrigine level with CMP Will give Valtoco as rescue medication  Return in 6 months or sooner if worse

## 2023-12-28 LAB — COMPREHENSIVE METABOLIC PANEL
ALT: 20 [IU]/L (ref 0–32)
AST: 16 [IU]/L (ref 0–40)
Albumin: 4.9 g/dL (ref 3.9–4.9)
Alkaline Phosphatase: 100 [IU]/L (ref 44–121)
BUN/Creatinine Ratio: 9 (ref 9–23)
BUN: 6 mg/dL (ref 6–20)
Bilirubin Total: 0.2 mg/dL (ref 0.0–1.2)
CO2: 20 mmol/L (ref 20–29)
Calcium: 10 mg/dL (ref 8.7–10.2)
Chloride: 97 mmol/L (ref 96–106)
Creatinine, Ser: 0.7 mg/dL (ref 0.57–1.00)
Globulin, Total: 2.4 g/dL (ref 1.5–4.5)
Glucose: 73 mg/dL (ref 70–99)
Potassium: 4.4 mmol/L (ref 3.5–5.2)
Sodium: 136 mmol/L (ref 134–144)
Total Protein: 7.3 g/dL (ref 6.0–8.5)
eGFR: 119 mL/min/{1.73_m2} (ref >59)

## 2023-12-28 LAB — LAMOTRIGINE LEVEL: Lamotrigine Lvl: 5.4 ug/mL (ref 2.0–20.0)

## 2024-01-01 ENCOUNTER — Ambulatory Visit (INDEPENDENT_AMBULATORY_CARE_PROVIDER_SITE_OTHER): Payer: 59

## 2024-01-01 VITALS — BP 98/60 | HR 92 | Temp 98.1°F | Wt 183.2 lb

## 2024-01-01 DIAGNOSIS — Z0000 Encounter for general adult medical examination without abnormal findings: Secondary | ICD-10-CM

## 2024-01-01 NOTE — Progress Notes (Signed)
 Subjective:   Danielle Ramsey is a 33 y.o. female who presents for Medicare Annual (Subsequent) preventive examination.  Visit Complete: In person  Cardiac Risk Factors include: advanced age (>71men, >50 women);dyslipidemia;obesity (BMI >30kg/m2)     Objective:    Today's Vitals   01/01/24 1340 01/01/24 1410  BP: 98/60   Pulse: 92   Temp: 98.1 F (36.7 C)   SpO2: 94%   Weight: 183 lb 3.2 oz (83.1 kg)   PainSc:  1    Body mass index is 35.78 kg/m.     01/01/2024    2:18 PM 12/26/2022    2:54 PM 04/12/2020    2:09 PM  Advanced Directives  Does Patient Have a Medical Advance Directive? No No No  Would patient like information on creating a medical advance directive? Yes (MAU/Ambulatory/Procedural Areas - Information given) No - Patient declined Yes (MAU/Ambulatory/Procedural Areas - Information given)    Current Medications (verified) Outpatient Encounter Medications as of 01/01/2024  Medication Sig   albuterol  (PROAIR  HFA) 108 (90 Base) MCG/ACT inhaler Inhale 2 puffs into the lungs every 6 (six) hours as needed for wheezing or shortness of breath.   clindamycin  (CLINDAGEL) 1 % gel Apply topically 2 (two) times daily.   lamoTRIgine  (LAMICTAL ) 150 MG tablet Take 1 tablet (150 mg total) by mouth 2 (two) times daily.   montelukast  (SINGULAIR ) 10 MG tablet Take 1 tablet (10 mg total) by mouth daily.   diazePAM , 15 MG Dose, (VALTOCO  15 MG DOSE) 2 x 7.5 MG/0.1ML LQPK Place 15 mg into the nose as needed (For seizure lasting more than 5 minutes). (Patient not taking: Reported on 01/01/2024)   [DISCONTINUED] Lurasidone HCl (LATUDA PO) Take by mouth daily.   No facility-administered encounter medications on file as of 01/01/2024.    Allergies (verified) Codeine   History: Past Medical History:  Diagnosis Date   Asthma    Depression    Ectopic pregnancy    Hyperlipidemia    Seizures (HCC)    Past Surgical History:  Procedure Laterality Date   APPENDECTOMY     Family History   Problem Relation Age of Onset   Diabetes Mother    Hypertension Mother    Hyperlipidemia Mother    Early death Father 57   Stroke Paternal Grandmother    Colon cancer Neg Hx    Breast cancer Neg Hx    Social History   Socioeconomic History   Marital status: Single    Spouse name: Not on file   Number of children: Not on file   Years of education: Not on file   Highest education level: Some college, no degree  Occupational History   Not on file  Tobacco Use   Smoking status: Every Day    Current packs/day: 1.00    Average packs/day: 1 pack/day for 14.0 years (14.0 ttl pk-yrs)    Types: Cigarettes    Start date: 2011   Smokeless tobacco: Never  Vaping Use   Vaping status: Never Used  Substance and Sexual Activity   Alcohol  use: No   Drug use: No    Comment: and then smoked it agian and having seizure   Sexual activity: Yes  Other Topics Concern   Not on file  Social History Narrative   Currently on disability for bipolar type I disorder   Was hospitalized at age 62-18   Works at programme researcher, broadcasting/film/video as a production designer, theatre/television/film   Social Drivers of Corporate Investment Banker Strain: Low Risk  (  01/01/2024)   Overall Financial Resource Strain (CARDIA)    Difficulty of Paying Living Expenses: Not hard at all  Food Insecurity: No Food Insecurity (01/01/2024)   Hunger Vital Sign    Worried About Running Out of Food in the Last Year: Never true    Ran Out of Food in the Last Year: Never true  Transportation Needs: No Transportation Needs (01/01/2024)   PRAPARE - Administrator, Civil Service (Medical): No    Lack of Transportation (Non-Medical): No  Physical Activity: Insufficiently Active (01/01/2024)   Exercise Vital Sign    Days of Exercise per Week: 4 days    Minutes of Exercise per Session: 30 min  Stress: Stress Concern Present (01/01/2024)   [employer] of Occupational Health - Occupational Stress Questionnaire    Feeling of Stress : To some extent  Social Connections:  Socially Isolated (01/01/2024)   Social Connection and Isolation Panel [NHANES]    Frequency of Communication with Friends and Family: More than three times a week    Frequency of Social Gatherings with Friends and Family: Three times a week    Attends Religious Services: Never    Active Member of Clubs or Organizations: No    Attends Banker Meetings: Never    Marital Status: Never married    Tobacco Counseling Ready to quit: Not Answered Counseling given: Not Answered   Clinical Intake:  Pre-visit preparation completed: Yes  Pain : 0-10 Pain Score: 1  Pain Type: Acute pain Pain Location: Neck Pain Descriptors / Indicators: Aching, Sharp Pain Onset: 1 to 4 weeks ago Pain Frequency: Constant     BMI - recorded: 35.78 Nutritional Status: BMI > 30  Obese Nutritional Risks: None Diabetes: No  How often do you need to have someone help you when you read instructions, pamphlets, or other written materials from your doctor or pharmacy?: 1 - Never  Interpreter Needed?: No  Information entered by :: Ellouise Haws, LPN   Activities of Daily Living    01/01/2024    2:12 PM  In your present state of health, do you have any difficulty performing the following activities:  Hearing? 1  Comment HOH with various tone  Vision? 0  Difficulty concentrating or making decisions? 0  Walking or climbing stairs? 0  Dressing or bathing? 0  Doing errands, shopping? 0  Preparing Food and eating ? N  Using the Toilet? N  In the past six months, have you accidently leaked urine? N  Do you have problems with loss of bowel control? N  Managing your Medications? N  Managing your Finances? N  Housekeeping or managing your Housekeeping? N    Patient Care Team: Job Lukes, [place] as PCP - General (Physician Assistant) Center, [facility] as Consulting Physician (Behavioral Health) Glena Collard, MD as Referring Physician (Obstetrics and  Gynecology)  Indicate any recent Medical Services you may have received from other than [facility] providers in the past year (date may be approximate).     Assessment:   This is a routine wellness examination for Danielle Ramsey.  Hearing/Vision screen Hearing Screening - Comments:: HOH tones  Vision Screening - Comments:: Pt follows up with my eye Dr    Mattie Addressed             This Visit's Progress    Patient Stated       Lose weight        Depression Screen    01/01/2024  2:15 PM 12/26/2022    2:57 PM 10/24/2022    3:45 PM 04/12/2020    2:10 PM 01/05/2020    1:26 PM 06/13/2019    1:53 PM  PHQ 2/9 Scores  PHQ - 2 Score 1 0 2 3 4 2   PHQ- 9 Score 2  10 8 16 10     Fall Risk    01/01/2024    2:18 PM 12/26/2022    2:56 PM 04/12/2020    2:10 PM 06/13/2019    1:53 PM  Fall Risk   Falls in the past year? 1 0 0 0  Number falls in past yr: 1 0 0 0  Injury with Fall? 1 0 0 0  Comment bruised right knee     Risk for fall due to : History of fall(s);Impaired balance/gait;Impaired mobility Impaired vision No Fall Risks   Risk for fall due to: Comment slipped in shower     Follow up Falls prevention discussed Falls prevention discussed Falls evaluation completed;Education provided;Falls prevention discussed     MEDICARE RISK AT HOME: Medicare Risk at Home Any stairs in or around the home?: No If so, are there any without handrails?: No Home free of loose throw rugs in walkways, pet beds, electrical cords, etc?: Yes Adequate lighting in your home to reduce risk of falls?: Yes Life alert?: No Use of a cane, walker or w/c?: No Grab bars in the bathroom?: Yes Shower chair or bench in shower?: No Elevated toilet seat or a handicapped toilet?: No  TIMED UP AND GO:  Was the test performed?  Yes  Length of time to ambulate 10 feet: 10 sec Gait steady and fast without use of assistive device    Cognitive Function:        01/01/2024    2:20 PM 12/26/2022    3:00 PM  6CIT Screen   What Year? 0 points 0 points  What month? 0 points 0 points  What time? 0 points 0 points  Count back from 20 0 points 0 points  Months in reverse 0 points 0 points  Repeat phrase 0 points 0 points  Total Score 0 points 0 points    Immunizations Immunization History  Administered Date(s) Administered   Tdap 01/05/2020    TDAP status: Up to date  Flu Vaccine status: Declined, Education has been provided regarding the importance of this vaccine but patient still declined. Advised may receive this vaccine at local pharmacy or Health Dept. Aware to provide a copy of the vaccination record if obtained from local pharmacy or Health Dept. Verbalized acceptance and understanding.   Covid-19 vaccine status: Declined, Education has been provided regarding the importance of this vaccine but patient still declined. Advised may receive this vaccine at local pharmacy or Health Dept.or vaccine clinic. Aware to provide a copy of the vaccination record if obtained from local pharmacy or Health Dept. Verbalized acceptance and understanding.  Qualifies for Shingles Vaccine? No    Screening Tests Health Maintenance  Topic Date Due   Pneumococcal Vaccine 47-71 Years old (1 of 2 - PCV) Never done   Hepatitis C Screening  Never done   Cervical Cancer Screening (HPV/Pap Cotest)  01/15/2023   INFLUENZA VACCINE  03/17/2024 (Originally 07/19/2023)   Medicare Annual Wellness (AWV)  12/31/2024   DTaP/Tdap/Td (2 - Td or Tdap) 01/04/2030   HIV Screening  Completed   HPV VACCINES  Aged Out   COVID-19 Vaccine  Discontinued    Health Maintenance  Health Maintenance Due  Topic Date Due   Pneumococcal Vaccine 6-32 Years old (1 of 2 - PCV) Never done   Hepatitis C Screening  Never done   Cervical Cancer Screening (HPV/Pap Cotest)  01/15/2023           Additional Screening:  Hepatitis C Screening: does qualify  Vision Screening: Recommended annual ophthalmology exams for early detection of  glaucoma and other disorders of the eye. Is the patient up to date with their annual eye exam?  Yes  Who is the provider or what is the name of the office in which the patient attends annual eye exams? My eye Dr  If pt is not established with a provider, would they like to be referred to a provider to establish care? No .   Dental Screening: Recommended annual dental exams for proper oral hygiene  Community Resource Referral / Chronic Care Management: CRR required this visit?  No   CCM required this visit?  No     Plan:     I have personally reviewed and noted the following in the patient's chart:   Medical and social history Use of alcohol , tobacco or illicit drugs  Current medications and supplements including opioid prescriptions. Patient is not currently taking opioid prescriptions. Functional ability and status Nutritional status Physical activity Advanced directives List of other physicians Hospitalizations, surgeries, and ER visits in previous 12 months Vitals Screenings to include cognitive, depression, and falls Referrals and appointments  In addition, I have reviewed and discussed with patient certain preventive protocols, quality metrics, and best practice recommendations. A written personalized care plan for preventive services as well as general preventive health recommendations were provided to patient.     Ellouise VEAR Haws, LPN   [id number]   After Visit Summary: (MyChart) Due to this being a telephonic visit, the after visit summary with patients personalized plan was offered to patient via MyChart   Nurse Notes: none

## 2024-01-01 NOTE — Patient Instructions (Signed)
 Ms. Steichen , Thank you for taking time to come for your Medicare Wellness Visit. I appreciate your ongoing commitment to your health goals. Please review the following plan we discussed and let me know if I can assist you in the future.   Referrals/Orders/Follow-Ups/Clinician Recommendations: If you wish to quit smoking, help is available. For free tobacco cessation program offerings call the [facility] at [phone] or Live Well Line at (570)339-2882. You may also visit [email or web address] or email [email or web address] for more information on other programs.   You may also call 1-800-QUIT-NOW (984-235-0863) or visit www.[place].ch or www.BecomeAnEx.org for additional resources on smoking cessation.   Aim for 30 minutes of exercise or brisk walking, 6-8 glasses of water, and 5 servings of fruits and vegetables each day. Continue to work on  losing weight   This is a list of the screening recommended for you and due dates:  Health Maintenance  Topic Date Due   Pneumococcal Vaccination (1 of 2 - PCV) Never done   Hepatitis C Screening  Never done   Pap with HPV screening  01/15/2023   Flu Shot  Never done   Medicare Annual Wellness Visit  12/31/2024   DTaP/Tdap/Td vaccine (2 - Td or Tdap) 01/04/2030   HIV Screening  Completed   HPV Vaccine  Aged Out   COVID-19 Vaccine  Discontinued    Advanced directives: (Provided) Advance directive discussed with you today. I have provided a copy for you to complete at home and have notarized. Once this is complete, please bring a copy in to our office so we can scan it into your chart.   Next Medicare Annual Wellness Visit scheduled for next year: Yes

## 2024-01-15 ENCOUNTER — Telehealth: Payer: Self-pay | Admitting: Pharmacy Technician

## 2024-01-15 ENCOUNTER — Telehealth: Payer: Self-pay | Admitting: Neurology

## 2024-01-15 ENCOUNTER — Other Ambulatory Visit (HOSPITAL_COMMUNITY): Payer: Self-pay

## 2024-01-15 NOTE — Telephone Encounter (Signed)
PA request has been Submitted. New Encounter created for follow up. For additional info see Pharmacy Prior Auth telephone encounter from 01/15/2024.

## 2024-01-15 NOTE — Telephone Encounter (Signed)
Pharmacy Patient Advocate Encounter   Received notification from Pt Calls Messages that prior authorization for Valtoco 15 MG Dose 7.5MG /0.1ML liquid is required/requested.   Insurance verification completed.   The patient is insured through [facility] .   Per test claim: PA required; PA submitted to above mentioned insurance via CoverMyMeds Key/confirmation #/EOC [id number] Status is pending

## 2024-01-15 NOTE — Telephone Encounter (Signed)
Pt said triied to get diazePAM, 15 MG Dose, (VALTOCO 15 MG DOSE) 2 x 7.5 MG/0.1ML LQPK filledl. They told me they cannot refill it because insurance is not covering it, also doctor needs to send something to the pharmacy. Would like a call back.

## 2024-01-15 NOTE — Telephone Encounter (Signed)
Call to patient, she reports needing PA for valtoco. Advised I would send to team and I reviewed medication instructions and administration. She verbalized understanding

## 2024-01-31 ENCOUNTER — Encounter: Payer: Self-pay | Admitting: Physician Assistant

## 2024-01-31 NOTE — Telephone Encounter (Signed)
Would you like pt to schedule an OV/VV to discuss options

## 2024-02-01 NOTE — Telephone Encounter (Signed)
Pharmacy Patient Advocate Encounter  Received notification from [facility] that Prior Authorization for Valtoco 15 MG Dose 7.5MG /0.1ML liquid  has been DENIED.  Full denial letter will be uploaded to the media tab. See denial reason below. Medicare allows [place] to cover a drug only when it is a Part D drug. A Part D drug is one that is used for a "medically accepted indication." A medically accepted indication means the use is approved by the Food and Drug Administration (FDA) OR the use is supported by one of the following accepted references: (1) [facility] Formulary Service Drug Information. (2) [employer] DRUGDEX Information System. VALTOCO SPR 15MG  is not FDA approved or supported by an accepted reference for your medical condition(s): Epilepsy. In order to approve your medication, you must also have frequent seizure activity (in other words, seizure clusters, acute repetitive seizures) that are distinct from your usual seizure pattern. Therefore, your drug is denied because it is not being used for a "medically accepted indication.  PA #/Case ID/Reference #: [id number]

## 2024-02-04 NOTE — Telephone Encounter (Signed)
She is the rep for Valtoco, I think April has her contact info.

## 2024-02-04 NOTE — Telephone Encounter (Signed)
Can we forward patient information to Sims from [place] [facility]. Hopefully, she can help

## 2024-02-04 NOTE — Telephone Encounter (Signed)
Email sent to valtoco rep: [email or web address]  Waiting on response to next steps

## 2024-02-05 ENCOUNTER — Other Ambulatory Visit (HOSPITAL_COMMUNITY): Payer: Self-pay

## 2024-02-05 NOTE — Telephone Encounter (Signed)
Medicare denied the PA unable to submit to Medicaid.

## 2024-02-05 NOTE — Telephone Encounter (Signed)
Pod will need to reach out to [email or web address]  the rep and ask for help

## 2024-02-06 ENCOUNTER — Other Ambulatory Visit (HOSPITAL_COMMUNITY): Payer: Self-pay

## 2024-02-06 ENCOUNTER — Telehealth: Payer: Self-pay

## 2024-02-06 NOTE — Telephone Encounter (Signed)
PA request has been Started. New Encounter created for follow up. For additional info see Pharmacy Prior Auth telephone encounter from 02/06/2024.

## 2024-02-06 NOTE — Telephone Encounter (Signed)
Drug rep called and stated that this should be able to be ran through medicaid on nctracks or by phone to them as well.    Routing to pa team to run through [facility]. We need to know exactly what pushback message they are getting when they try to run it

## 2024-02-06 NOTE — Telephone Encounter (Signed)
I was asked to submit PA to [facility] Tracks-Unable to do it per NCTracks portal due to this medication is not an option to choose from-will outreach via telephone.

## 2024-02-06 NOTE — Telephone Encounter (Signed)
  Awaiting rep to contact us

## 2024-02-07 NOTE — Telephone Encounter (Signed)
Please contact pt to schedule visit per PCP recommendations

## 2024-02-13 ENCOUNTER — Encounter: Payer: Self-pay | Admitting: Physician Assistant

## 2024-02-13 ENCOUNTER — Ambulatory Visit (INDEPENDENT_AMBULATORY_CARE_PROVIDER_SITE_OTHER): Payer: 59 | Admitting: Physician Assistant

## 2024-02-13 VITALS — BP 120/76 | HR 100 | Temp 98.4°F | Ht 60.0 in | Wt 188.5 lb

## 2024-02-13 DIAGNOSIS — E669 Obesity, unspecified: Secondary | ICD-10-CM | POA: Diagnosis not present

## 2024-02-13 DIAGNOSIS — R413 Other amnesia: Secondary | ICD-10-CM | POA: Diagnosis not present

## 2024-02-13 DIAGNOSIS — N979 Female infertility, unspecified: Secondary | ICD-10-CM

## 2024-02-13 LAB — CBC WITH DIFFERENTIAL/PLATELET
Basophils Absolute: 0.1 10*3/uL (ref 0.0–0.1)
Basophils Relative: 0.6 % (ref 0.0–3.0)
Eosinophils Absolute: 0.1 10*3/uL (ref 0.0–0.7)
Eosinophils Relative: 1.1 % (ref 0.0–5.0)
HCT: 44.6 % (ref 36.0–46.0)
Hemoglobin: 15 g/dL (ref 12.0–15.0)
Lymphocytes Relative: 40.8 % (ref 12.0–46.0)
Lymphs Abs: 3.8 10*3/uL (ref 0.7–4.0)
MCHC: 33.6 g/dL (ref 30.0–36.0)
MCV: 87.9 fL (ref 78.0–100.0)
Monocytes Absolute: 0.5 10*3/uL (ref 0.1–1.0)
Monocytes Relative: 5.8 % (ref 3.0–12.0)
Neutro Abs: 4.7 10*3/uL (ref 1.4–7.7)
Neutrophils Relative %: 51.7 % (ref 43.0–77.0)
Platelets: 290 10*3/uL (ref 150.0–400.0)
RBC: 5.08 Mil/uL (ref 3.87–5.11)
RDW: 13.8 % (ref 11.5–15.5)
WBC: 9.2 10*3/uL (ref 4.0–10.5)

## 2024-02-13 LAB — TSH: TSH: 1.71 u[IU]/mL (ref 0.35–5.50)

## 2024-02-13 LAB — COMPREHENSIVE METABOLIC PANEL
ALT: 21 U/L (ref 0–35)
AST: 19 U/L (ref 0–37)
Albumin: 4.7 g/dL (ref 3.5–5.2)
Alkaline Phosphatase: 76 U/L (ref 39–117)
BUN: 8 mg/dL (ref 6–23)
CO2: 25 meq/L (ref 19–32)
Calcium: 9.6 mg/dL (ref 8.4–10.5)
Chloride: 100 meq/L (ref 96–112)
Creatinine, Ser: 0.69 mg/dL (ref 0.40–1.20)
GFR: 115.09 mL/min (ref >60.00)
Glucose, Bld: 81 mg/dL (ref 70–99)
Potassium: 4 meq/L (ref 3.5–5.1)
Sodium: 136 meq/L (ref 135–145)
Total Bilirubin: 0.4 mg/dL (ref 0.2–1.2)
Total Protein: 7.8 g/dL (ref 6.0–8.3)

## 2024-02-13 LAB — FOLLICLE STIMULATING HORMONE: FSH: 8.4 m[IU]/mL

## 2024-02-13 LAB — LIPID PANEL
Cholesterol: 220 mg/dL — ABNORMAL HIGH (ref 0–200)
HDL: 38.7 mg/dL — ABNORMAL LOW (ref >39.00)
LDL Cholesterol: 132 mg/dL — ABNORMAL HIGH (ref 0–99)
NonHDL: 181.08
Total CHOL/HDL Ratio: 6
Triglycerides: 247 mg/dL — ABNORMAL HIGH (ref 0.0–149.0)
VLDL: 49.4 mg/dL — ABNORMAL HIGH (ref 0.0–40.0)

## 2024-02-13 LAB — LUTEINIZING HORMONE: LH: 10.36 m[IU]/mL

## 2024-02-13 LAB — HEMOGLOBIN A1C: Hgb A1c MFr Bld: 5.7 % (ref 4.6–6.5)

## 2024-02-13 NOTE — Progress Notes (Signed)
 Danielle Ramsey is a 33 y.o. female here for a new problem.  History of Present Illness:   Chief Complaint  Patient presents with   Contraception    Pt would like to discuss starting something for contraception   Referral    For GYN    HPI  Fertility Patient is expressing interest in discussing contraceptives to regulate her hormones as she is having some difficulty getting pregnant.  She also reports being overly emotional this past month. She is tearful in office today.  Menstrual cycles are currently regular. Cycle length is 26 days with an average period of 5 days.  She last followed up with obgyn in 2021 where she underwent an HSG test but could not complete it due to experiencing pain from the dye.  Patient reports a history of miscarriage in 2012 and ectopic pregnancy 4 months after. Ectopic pregnancy was managed with methotrexate. She is requesting an OBY referral today.  Reports compliance and good tolerance of Lamictal 150 mg daily. She states that she's following up with a dentist since her last office visit. Continues to be a smoker.   Obesity She is interested in losing weight but is having difficulty losing any. Currently not drinking sodas but is having juices and water.  Exercise is minimal, occasional walks. She is agreeable to increasing her exercise levels.  She averages 5000 steps a day at this time.  Will check blood work today.    Memory Loss She complains of having difficulty recalling certain events, names, or activities. Memory loss has been worsening over the past year. She states that her seizures are contributing to her symptoms as they started to worsen following her seizure onset. She states that she's unable to discuss this with her neurologist as she is nervous. CT head on 05-23-23 was unremarkable for any significant findings.    Past Medical History:  Diagnosis Date   Asthma    Depression    Ectopic pregnancy    Hyperlipidemia    Seizures (HCC)       Social History   Tobacco Use   Smoking status: Every Day    Current packs/day: 1.00    Average packs/day: 1 pack/day for 14.2 years (14.2 ttl pk-yrs)    Types: Cigarettes    Start date: 2011   Smokeless tobacco: Never  Vaping Use   Vaping status: Never Used  Substance Use Topics   Alcohol use: No   Drug use: No    Comment: and then smoked it agian and having seizure    Past Surgical History:  Procedure Laterality Date   APPENDECTOMY      Family History  Problem Relation Age of Onset   Diabetes Mother    Hypertension Mother    Hyperlipidemia Mother    Early death Father 10   Stroke Paternal Grandmother    Colon cancer Neg Hx    Breast cancer Neg Hx     Allergies  Allergen Reactions   Codeine Other (See Comments)    Patient states she gets "loopy"    Current Medications:   Current Outpatient Medications:    albuterol (PROAIR HFA) 108 (90 Base) MCG/ACT inhaler, Inhale 2 puffs into the lungs every 6 (six) hours as needed for wheezing or shortness of breath., Disp: 18 g, Rfl: 2   clindamycin (CLINDAGEL) 1 % gel, Apply topically 2 (two) times daily., Disp: 30 g, Rfl: 0   lamoTRIgine (LAMICTAL) 150 MG tablet, Take 1 tablet (150 mg total) by mouth 2 (  two) times daily., Disp: 180 tablet, Rfl: 3   montelukast (SINGULAIR) 10 MG tablet, Take 1 tablet (10 mg total) by mouth daily., Disp: 90 tablet, Rfl: 1   Review of Systems:   Review of Systems  Neurological:  Positive for seizures.  Psychiatric/Behavioral:  Positive for memory loss.     Vitals:   Vitals:   02/13/24 1115  BP: 120/76  Pulse: 100  Temp: 98.4 F (36.9 C)  TempSrc: Temporal  SpO2: 97%  Weight: 188 lb 8 oz (85.5 kg)  Height: 5' (1.524 m)     Body mass index is 36.81 kg/m.  Physical Exam:   Physical Exam Vitals and nursing note reviewed.  Constitutional:      General: She is not in acute distress.    Appearance: She is well-developed. She is not ill-appearing or toxic-appearing.   Cardiovascular:     Rate and Rhythm: Normal rate and regular rhythm.     Pulses: Normal pulses.     Heart sounds: Normal heart sounds, S1 normal and S2 normal.  Pulmonary:     Effort: Pulmonary effort is normal.     Breath sounds: Normal breath sounds.  Skin:    General: Skin is warm and dry.  Neurological:     Mental Status: She is alert.     GCS: GCS eye subscore is 4. GCS verbal subscore is 5. GCS motor subscore is 6.  Psychiatric:        Attention and Perception: Attention normal.        Mood and Affect: Mood is anxious. Affect is tearful.        Speech: Speech normal.        Behavior: Behavior normal. Behavior is cooperative.     Assessment and Plan:   Female fertility problem Update blood work Discussed need for close follow up with gynecology to discuss -- will place referral for obstetrics-gyn  Obesity, unspecified class, unspecified obesity type, unspecified whether serious comorbidity present Encouraged exercise Discussed goals for exercise and need for consistency Continue diabetic-friendly diet   Memory loss Update blood work today but discussed need for prioritizing mental health and self care Discussed need to follow up with neurology as well to discuss ongoing issues   Jarold Motto, PA-C  I,Safa M Kadhim,acting as a scribe for Jarold Motto, PA.,have documented all relevant documentation on the behalf of Jarold Motto, PA,as directed by  Jarold Motto, PA while in the presence of Jarold Motto, [place].   I, Jarold Motto, [place], have reviewed all documentation for this visit. The documentation on 02/13/24 for the exam, diagnosis, procedures, and orders are all accurate and complete.

## 2024-02-13 NOTE — Patient Instructions (Signed)
 It was great to see you!  We will get blood work today  Goal is 150 minutes per week for exercise  We will refer you to [facility] obstetrics-gynecology     Let's follow-up in 3-6 months to check in on everything, sooner if you have concerns.  Take care,  Jarold Motto PA-C

## 2024-02-14 ENCOUNTER — Encounter: Payer: Self-pay | Admitting: Physician Assistant

## 2024-03-12 ENCOUNTER — Other Ambulatory Visit (HOSPITAL_COMMUNITY): Payer: Self-pay

## 2024-06-17 ENCOUNTER — Encounter: Payer: Self-pay | Admitting: Physician Assistant

## 2024-06-17 ENCOUNTER — Ambulatory Visit (INDEPENDENT_AMBULATORY_CARE_PROVIDER_SITE_OTHER): Payer: 59 | Admitting: Physician Assistant

## 2024-06-17 VITALS — BP 120/76 | HR 100 | Temp 98.1°F | Ht 60.0 in | Wt 185.5 lb

## 2024-06-17 DIAGNOSIS — N979 Female infertility, unspecified: Secondary | ICD-10-CM

## 2024-06-17 DIAGNOSIS — E559 Vitamin D deficiency, unspecified: Secondary | ICD-10-CM

## 2024-06-17 DIAGNOSIS — E88819 Insulin resistance, unspecified: Secondary | ICD-10-CM | POA: Diagnosis not present

## 2024-06-17 DIAGNOSIS — K219 Gastro-esophageal reflux disease without esophagitis: Secondary | ICD-10-CM | POA: Diagnosis not present

## 2024-06-17 DIAGNOSIS — R413 Other amnesia: Secondary | ICD-10-CM

## 2024-06-17 LAB — IBC + FERRITIN
Ferritin: 51.4 ng/mL (ref 10.0–291.0)
Iron: 70 ug/dL (ref 42–145)
Saturation Ratios: 19.8 % — ABNORMAL LOW (ref 20.0–50.0)
TIBC: 352.8 ug/dL (ref 250.0–450.0)
Transferrin: 252 mg/dL (ref 212.0–360.0)

## 2024-06-17 LAB — VITAMIN D 25 HYDROXY (VIT D DEFICIENCY, FRACTURES): VITD: 27.37 ng/mL — ABNORMAL LOW (ref 30.00–100.00)

## 2024-06-17 LAB — HEMOGLOBIN A1C: Hgb A1c MFr Bld: 5.9 % (ref 4.6–6.5)

## 2024-06-17 LAB — VITAMIN B12: Vitamin B-12: 424 pg/mL (ref 211–911)

## 2024-06-17 MED ORDER — PANTOPRAZOLE SODIUM 40 MG PO TBEC: 40.0000 mg | DELAYED_RELEASE_TABLET | Freq: Every day | ORAL | 1 refills | Status: DC

## 2024-06-17 MED ORDER — METFORMIN HCL ER 500 MG PO TB24: 500.0000 mg | ORAL_TABLET | Freq: Every day | ORAL | 1 refills | Status: AC

## 2024-06-17 NOTE — Progress Notes (Signed)
 Danielle Ramsey is a 33 y.o. female here for a follow up of a pre-existing problem.  History of Present Illness:   Chief Complaint  Patient presents with   Medical Management of Chronic Issues    Fertility issue, Obesity, and Memory loss. Pt says memory loss seems a little worse, gets lost in doing things and then has to start over. Pt is scheduled to see Neurology on 8/2.    HPI  Fertility Pt reports the previously referred gynecology office did not take her insurance, so she was unable to see them. She notes she has heavy periods that last 4-6 days. She has 2 heavy days during which she has to change pads/tampons every 2-3 hours. Denies taking iron supplements. She also notes she is dating someone and she endorses that he takes care of her. Pt would like a referral to a different gynecology team.  Obesity Pt has lost 3 lb since her last visit. She states she is unable to get to the gym, but she gets 5k steps daily by doing chores around the house. She expresses hesitation in trying injectables. She no longer drinks juice, but she drinks sweet tea at dinner, 1 cup of coffee, and endorses higher water intake. She endorses eating Little Debbie cakes and states that fruits are unaffordable for her.  GERD She notes she has to take Pepto daily whenever and whatever she eats. She would like a prescription medication for this.  Memory loss Pt reports her memory loss has worsened. She states she gets lost while doing tasks which prompts her to start over. She feels like she does not get enough sleep. She goes to bed at 12AM and wakes up by 6-7AM. She reports feeling tired, but has difficulty falling asleep. Though she drinks tea with dinner, she eats by 6:30-7PM. She notes she has been seizure-free for 6 months by 6/19. Endorses playing brain games like word searches or mazes. Pt is scheduled to see neurology on 8/2.  Past Medical History:  Diagnosis Date   Asthma    Depression     Ectopic pregnancy    Hyperlipidemia    Seizures (HCC)      Social History   Tobacco Use   Smoking status: Every Day    Current packs/day: 1.00    Average packs/day: 1 pack/day for 14.5 years (14.5 ttl pk-yrs)    Types: Cigarettes    Start date: 2011   Smokeless tobacco: Never  Vaping Use   Vaping status: Never Used  Substance Use Topics   Alcohol  use: No   Drug use: No    Comment: and then smoked it agian and having seizure    Past Surgical History:  Procedure Laterality Date   APPENDECTOMY      Family History  Problem Relation Age of Onset   Diabetes Mother    Hypertension Mother    Hyperlipidemia Mother    Early death Father 11   Stroke Paternal Grandmother    Colon cancer Neg Hx    Breast cancer Neg Hx     Allergies  Allergen Reactions   Codeine Other (See Comments)    Patient states she gets loopy    Current Medications:   Current Outpatient Medications:    albuterol  (PROAIR  HFA) 108 (90 Base) MCG/ACT inhaler, Inhale 2 puffs into the lungs every 6 (six) hours as needed for wheezing or shortness of breath., Disp: 18 g, Rfl: 2   clindamycin  (CLINDAGEL) 1 % gel, Apply topically 2 (two) times  daily., Disp: 30 g, Rfl: 0   lamoTRIgine  (LAMICTAL ) 150 MG tablet, Take 1 tablet (150 mg total) by mouth 2 (two) times daily., Disp: 180 tablet, Rfl: 3   metFORMIN  (GLUCOPHAGE -XR) 500 MG 24 hr tablet, Take 1 tablet (500 mg total) by mouth daily with breakfast., Disp: 90 tablet, Rfl: 1   montelukast  (SINGULAIR ) 10 MG tablet, Take 1 tablet (10 mg total) by mouth daily., Disp: 90 tablet, Rfl: 1   pantoprazole (PROTONIX) 40 MG tablet, Take 1 tablet (40 mg total) by mouth daily., Disp: 90 tablet, Rfl: 1   Review of Systems:   Negative unless otherwise specified per HPI.  Vitals:   Vitals:   06/17/24 0912  BP: 120/76  Pulse: 100  Temp: 98.1 F (36.7 C)  TempSrc: Temporal  SpO2: 95%  Weight: 185 lb 8 oz (84.1 kg)  Height: 5' (1.524 m)     Body mass index is  36.23 kg/m.  Physical Exam:   Physical Exam Constitutional:      Appearance: Normal appearance. She is well-developed.  HENT:     Head: Normocephalic and atraumatic.   Eyes:     General: Lids are normal.     Extraocular Movements: Extraocular movements intact.     Conjunctiva/sclera: Conjunctivae normal.   Pulmonary:     Effort: Pulmonary effort is normal.   Musculoskeletal:        General: Normal range of motion.     Cervical back: Normal range of motion and neck supple.   Skin:    General: Skin is warm and dry.   Neurological:     Mental Status: She is alert and oriented to person, place, and time.   Psychiatric:        Attention and Perception: Attention and perception normal.        Mood and Affect: Mood normal.        Behavior: Behavior normal.        Thought Content: Thought content normal.        Judgment: Judgment normal.     Assessment and Plan:   1. Female fertility problem (Primary) - Ambulatory referral to Gynecology Will resubmit referral for gynecology Will also start low dose metformin  500 mg XR given insulin resistance Continue efforts at weight loss  2. Memory loss - Vitamin B12 - IBC + Ferritin Ongoing I encouraged her to discuss this with her neurologist We will update her blood work to rule out additional organic causes of symptom(s) We discussed the importance of sleep and limiting screen time to help with her symptom(s) as well  3. Insulin resistance Update Hemoglobin A1c Start low dose metformin  500 management XR  Follow up in 3 months, sooner if concerns  5. Vitamin D deficiency - VITAMIN D 25 Hydroxy (Vit-D Deficiency, Fractures)  6. Gastroesophageal reflux disease, unspecified whether esophagitis present   Uncontrolled No red flags Trial protonix 40 mg daily Continue weight loss efforts Follow up in 3-6 month(s), sooner if concerns    I, Lavern Simmers, acting as a Neurosurgeon for [employer], [place]., have documented all  relevant documentation on the behalf of Lucie Buttner, [place], as directed by Lucie Buttner, PA while in the presence of Lucie Buttner, [place].  I, Lucie Buttner, [place], have reviewed all documentation for this visit. The documentation on 06/17/24 for the exam, diagnosis, procedures, and orders are all accurate and complete.  Lucie Buttner, PA-C

## 2024-06-17 NOTE — Patient Instructions (Signed)
 It was great to see you!  Sleep Hygiene  Do: (1) Go to bed at the same time each day. (2) Get up from bed at the same time each day. (3) Get regular exercise each day, preferably in the morning.  There is goof evidence that regular exercise improves restful sleep.  This includes stretching and aerobic exercise. (4) Get regular exposure to outdoor or bright lights, especially in the late afternoon. (5) Keep the temperature in your bedroom comfortable. (6) Keep the bedroom quiet when sleeping. (7) Keep the bedroom dark enough to facilitate sleep. (8) Use your bed only for sleep and sex. (9) Take medications as directed.  It is helpful to take prescribed sleeping pills 1 hour before bedtime, so they are causing drowsiness when you lie down, or 10 hours before getting up, to avoid daytime drowsiness. (10) Use a relaxation exercise just before going to sleep -- imagery, massage, warm bath. (11) Keep your feet and hands warm.  Wear warm socks and/or mittens or gloves to bed.  Don't: (1) Exercise just before going to bed. (2) Engage in stimulating activity just before bed, such as playing a competitive game, watching an exciting program on television, or having an important discussion with a loved one. (3) Have caffeine in the evening (coffee, teas, chocolate, sodas, etc.) (4) Read or watch television in bed. (5) Use alcohol  to help you sleep. (6) Go to bed too hungry or too full. (7) Take another person's sleeping pills. (8) Take over-the-counter sleeping pills, without your doctor's knowledge.  Tolerance can develop rapidly with these medications.  Diphenhydramine can have serious side effects for elderly patients. (9) Take daytime naps. (10) Command yourself to go to sleep.  This only makes your mind and body more alert.  If you lie awake for more than 20-30 minutes, get up, go to a different room, participate in a quiet activity (Ex - non-excitable reading or television), and then return to  bed when you feel sleepy.  Do this as many times during the night as needed.  This may cause you to have a night or two of poor sleep but it will train your brain to know when it is time for sleep.   Take care,  Lucie Buttner PA-C

## 2024-06-18 ENCOUNTER — Ambulatory Visit: Payer: Self-pay | Admitting: Physician Assistant

## 2024-07-24 ENCOUNTER — Ambulatory Visit: Payer: 59 | Admitting: Family Medicine

## 2024-07-30 NOTE — Patient Instructions (Signed)
 Below is our plan:  We will check some blood work today. We will order an EEG for evaluation. We will consider an extended release EEG if needed that weill be done at your home. We will increase lamotrigine  to 200mg  twice daily.   This week, take lamotrigine  150mg  in the mornings. Increase evening dose to 200mg . If tolerated well, increase dose to 200mg  twice daily thereafter.   Please make sure you are consistent with timing of seizure medication. I recommend annual visit with primary care provider (PCP) for complete physical and routine blood work. I recommend daily intake of vitamin D  (400-800iu) and calcium (800-1000mg ) for bone health. Discuss Dexa screening with PCP.   According to [place] law, you can not drive unless you are seizure / syncope free for at least 6 months and under physician's care.  Please maintain precautions. Do not participate in activities where a loss of awareness could harm you or someone else. No swimming alone, no tub bathing, no hot tubs, no driving, no operating motorized vehicles (cars, ATVs, motocycles, etc), lawnmowers, power tools or firearms. No standing at heights, such as rooftops, ladders or stairs. Avoid hot objects such as stoves, heaters, open fires. Wear a helmet when riding a bicycle, scooter, skateboard, etc. and avoid areas of traffic. Set your water heater to 120 degrees or less.  SUDEP is the sudden, unexpected death of someone with epilepsy, who was otherwise healthy. In SUDEP cases, no other cause of death is found when an autopsy is done. Each year, more than 1 in 1,000 people with epilepsy die from SUDEP. This is the leading cause of death in people with uncontrolled seizures. Until further answers are available, the best way to prevent SUDEP is to lower your risk by controlling seizures. Research has found that people with all types of epilepsy that experience convulsive seizures can be at risk.  Please make sure you are staying well hydrated. I  recommend 50-60 ounces daily. Well balanced diet and regular exercise encouraged. Consistent sleep schedule with 6-8 hours recommended.   Please continue follow up with care team as directed.   Follow up with me in 4-6 months   You may receive a survey regarding today's visit. I encourage you to leave honest feed back as I do use this information to improve patient care. Thank you for seeing me today!

## 2024-07-30 NOTE — Progress Notes (Unsigned)
 No chief complaint on file.   HISTORY OF PRESENT ILLNESS:  07/30/24 ALL:  Danielle Ramsey is a 33 y.o. female here today for follow up for seizures. She was last seen by Dr Gregg 12/2023. She reported breakthrough seizure 09/2023 and lamotrigine  dose was increased to 150mg  BID. Since,   [facility]    HISTORY (copied from Dr Janean previous note)  12/27/2023:  Patient presents today for follow-up, she is accompanied by her mother.  Last visit was in June, and at that time we started her on lamotrigine  and obtained a routine EEG.  Routine EEG was normal, no epileptiform discharges, and no abnormality.  She tells me she was doing well until the week before halloween when she did have a breakthrough seizure.  She reports compliance with her medication, denies any provoking factor.  Seizure was witnessed by significant other, denies any injury from the seizure.  Since then she has been doing well.  No other complaint no other concerns.     HISTORY OF PRESENT ILLNESS:  [name] with past medical history of bipolar disorder, asthma who is presenting to establish care for her new onset epilepsy.  Patient reports since April 2024, she had a total of 3 seizures.  On April 13, after using [facility] she had her first seizure during her sleep.  This was a generalized convulsion with urinary incontinence and tongue biting.  Boyfriend witnessed the event and noted that she was confused afterward.  She was taken to the hospital, initial workup was negative and she was recommended to follow-up with her PCP.  On June 5 she had 2 seizures, first one at 5 AM out of sleep and 3 hours later she had a second seizure.  After the second seizure she was also taken to the hospital at that time she did have a head CT which was negative, she did have an LP to rule out infection which was also unrevealing.  She was not started on medication at that time but was referred to neurology and PCP. Since June 5 she has  been doing well has not had any additional seizures.  She denies any previous history of seizures, denies any previous history of head injury.     Handedness: Right handed    Onset: April 2024   Seizure Type: Generalized convulsion    Current frequency: 2 seizures    Any injuries from seizures: Clemens off bed, tongue biting, right shoulder pain    Seizure risk factors: Mother in the setting of having a stroke   Previous ASMs: None    Currenty ASMs: Lamotrigine  100   ASMs side effects: Denies    Brain Images: Normal Head CT    Previous EEGs: Normal routine      OTHER MEDICAL CONDITIONS: Bipolar Disorder, Asthma    REVIEW OF SYSTEMS: Out of a complete 14 system review of symptoms, the patient complains only of the following symptoms, and all other reviewed systems are negative.   ALLERGIES: Allergies  Allergen Reactions   Codeine Other (See Comments)    Patient states she gets loopy     HOME MEDICATIONS: Outpatient Medications Prior to Visit  Medication Sig Dispense Refill   albuterol  (PROAIR  HFA) 108 (90 Base) MCG/ACT inhaler Inhale 2 puffs into the lungs every 6 (six) hours as needed for wheezing or shortness of breath. 18 g 2   clindamycin  (CLINDAGEL) 1 % gel Apply topically 2 (two) times daily. 30 g 0   lamoTRIgine  (LAMICTAL )  150 MG tablet Take 1 tablet (150 mg total) by mouth 2 (two) times daily. 180 tablet 3   metFORMIN  (GLUCOPHAGE -XR) 500 MG 24 hr tablet Take 1 tablet (500 mg total) by mouth daily with breakfast. 90 tablet 1   montelukast  (SINGULAIR ) 10 MG tablet Take 1 tablet (10 mg total) by mouth daily. 90 tablet 1   pantoprazole  (PROTONIX ) 40 MG tablet Take 1 tablet (40 mg total) by mouth daily. 90 tablet 1   No facility-administered medications prior to visit.     PAST MEDICAL HISTORY: Past Medical History:  Diagnosis Date   Asthma    Depression    Ectopic pregnancy    Hyperlipidemia    Seizures (HCC)      PAST SURGICAL HISTORY: Past  Surgical History:  Procedure Laterality Date   APPENDECTOMY       FAMILY HISTORY: Family History  Problem Relation Age of Onset   Diabetes Mother    Hypertension Mother    Hyperlipidemia Mother    Early death Father 8   Stroke Paternal Grandmother    Colon cancer Neg Hx    Breast cancer Neg Hx      SOCIAL HISTORY: Social History   Socioeconomic History   Marital status: Single    Spouse name: Not on file   Number of children: Not on file   Years of education: Not on file   Highest education level: Some college, no degree  Occupational History   Not on file  Tobacco Use   Smoking status: Every Day    Current packs/day: 1.00    Average packs/day: 1 pack/day for 14.6 years (14.6 ttl pk-yrs)    Types: Cigarettes    Start date: 2011   Smokeless tobacco: Never  Vaping Use   Vaping status: Never Used  Substance and Sexual Activity   Alcohol  use: No   Drug use: No    Comment: and then smoked it agian and having seizure   Sexual activity: Yes  Other Topics Concern   Not on file  Social History Narrative   Currently on disability for bipolar type I disorder   Was hospitalized at age 14-18   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researcher, broadcasting/film/video as a Production designer, theatre/television/film   Social Drivers of Corporate investment banker Strain: Low Risk  (01/01/2024)   Overall Financial Resource Strain (CARDIA)    Difficulty of Paying Living Expenses: Not hard at all  Food Insecurity: No Food Insecurity (01/01/2024)   Hunger Vital Sign    Worried About Running Out of Food in the Last Year: Never true    Ran Out of Food in the Last Year: Never true  Transportation Needs: No Transportation Needs (01/01/2024)   PRAPARE - Administrator, Civil Service (Medical): No    Lack of Transportation (Non-Medical): No  Physical Activity: Insufficiently Active (01/01/2024)   Exercise Vital Sign    Days of Exercise per Week: 4 days    Minutes of Exercise per Session: 30 min  Stress: Stress Concern Present (01/01/2024)   [employer] of Occupational Health - Occupational Stress Questionnaire    Feeling of Stress : To some extent  Social Connections: Socially Isolated (01/01/2024)   Social Connection and Isolation Panel    Frequency of Communication with Friends and Family: More than three times a week    Frequency of Social Gatherings with Friends and Family: Three times a week    Attends Religious Services: Never    Active Member of Clubs or Organizations:  No    Attends Banker Meetings: Never    Marital Status: Never married  Intimate Partner Violence: Not At Risk (01/01/2024)   Humiliation, Afraid, Rape, and Kick questionnaire    Fear of Current or Ex-Partner: No    Emotionally Abused: No    Physically Abused: No    Sexually Abused: No     PHYSICAL EXAM  There were no vitals filed for this visit. There is no height or weight on file to calculate BMI.  Generalized: Well developed, in no acute distress  Cardiology: normal rate and rhythm, no murmur auscultated  Respiratory: clear to auscultation bilaterally    Neurological examination  Mentation: Alert oriented to time, place, history taking. Follows all commands speech and language fluent Cranial nerve II-XII: Pupils were equal round reactive to light. Extraocular movements were full, visual field were full on confrontational test. Facial sensation and strength were normal. Uvula tongue midline. Head turning and shoulder shrug  were normal and symmetric. Motor: The motor testing reveals 5 over 5 strength of all 4 extremities. Good symmetric motor tone is noted throughout.  Sensory: Sensory testing is intact to soft touch on all 4 extremities. No evidence of extinction is noted.  Coordination: Cerebellar testing reveals good finger-nose-finger and heel-to-shin bilaterally.  Gait and station: Gait is normal. Tandem gait is normal. Romberg is negative. No drift is seen.  Reflexes: Deep tendon reflexes are symmetric and normal bilaterally.     DIAGNOSTIC DATA (LABS, IMAGING, TESTING) - I reviewed patient records, labs, notes, testing and imaging myself where available.  Lab Results  Component Value Date   WBC 9.2 02/13/2024   HGB 15.0 02/13/2024   HCT 44.6 02/13/2024   MCV 87.9 02/13/2024   PLT 290.0 02/13/2024      Component Value Date/Time   NA 136 02/13/2024 1220   NA 136 12/27/2023 1559   K 4.0 02/13/2024 1220   CL 100 02/13/2024 1220   CO2 25 02/13/2024 1220   GLUCOSE 81 02/13/2024 1220   BUN 8 02/13/2024 1220   BUN 6 12/27/2023 1559   CREATININE 0.69 02/13/2024 1220   CALCIUM 9.6 02/13/2024 1220   PROT 7.8 02/13/2024 1220   PROT 7.3 12/27/2023 1559   ALBUMIN 4.7 02/13/2024 1220   ALBUMIN 4.9 12/27/2023 1559   AST 19 02/13/2024 1220   ALT 21 02/13/2024 1220   ALKPHOS 76 02/13/2024 1220   BILITOT 0.4 02/13/2024 1220   BILITOT 0.2 12/27/2023 1559   Lab Results  Component Value Date   CHOL 220 (H) 02/13/2024   HDL 38.70 (L) 02/13/2024   LDLCALC 132 (H) 02/13/2024   LDLDIRECT 154.0 10/24/2022   TRIG 247.0 (H) 02/13/2024   CHOLHDL 6 02/13/2024   Lab Results  Component Value Date   HGBA1C 5.9 06/17/2024   Lab Results  Component Value Date   VITAMINB12 424 06/17/2024   Lab Results  Component Value Date   TSH 1.71 02/13/2024        No data to display               No data to display           ASSESSMENT AND PLAN  33 y.o. year old female  has a past medical history of Asthma, Depression, Ectopic pregnancy, Hyperlipidemia, and Seizures (HCC). here with    No diagnosis found.  Harlene Pizza ***.  Healthy lifestyle habits encouraged. *** will follow up with PCP as directed. *** will return to see me in ***,  sooner if needed. *** verbalizes understanding and agreement with this plan.   No orders of the defined types were placed in this encounter.    No orders of the defined types were placed in this encounter.    Greig Forbes, MSN, FNP-C 07/30/2024, 4:24 PM  [facility] [address], [address] [place], [place] [address] [phone]

## 2024-08-05 ENCOUNTER — Ambulatory Visit (INDEPENDENT_AMBULATORY_CARE_PROVIDER_SITE_OTHER): Admitting: Family Medicine

## 2024-08-05 ENCOUNTER — Encounter: Payer: Self-pay | Admitting: Family Medicine

## 2024-08-05 VITALS — BP 122/84 | HR 86 | Ht 60.0 in | Wt 184.0 lb

## 2024-08-05 DIAGNOSIS — G40909 Epilepsy, unspecified, not intractable, without status epilepticus: Secondary | ICD-10-CM | POA: Diagnosis not present

## 2024-08-05 DIAGNOSIS — Z5181 Encounter for therapeutic drug level monitoring: Secondary | ICD-10-CM

## 2024-08-05 MED ORDER — LAMOTRIGINE 200 MG PO TABS: 200.0000 mg | ORAL_TABLET | Freq: Two times a day (BID) | ORAL | 3 refills | Status: AC

## 2024-08-06 ENCOUNTER — Ambulatory Visit (INDEPENDENT_AMBULATORY_CARE_PROVIDER_SITE_OTHER): Admitting: Neurology

## 2024-08-06 ENCOUNTER — Ambulatory Visit: Payer: Self-pay | Admitting: Family Medicine

## 2024-08-06 DIAGNOSIS — G40909 Epilepsy, unspecified, not intractable, without status epilepticus: Secondary | ICD-10-CM | POA: Diagnosis not present

## 2024-08-06 LAB — CBC WITH DIFFERENTIAL/PLATELET
Basophils Absolute: 0 x10E3/uL (ref 0.0–0.2)
Basos: 0 %
EOS (ABSOLUTE): 0.2 x10E3/uL (ref 0.0–0.4)
Eos: 2 %
Hematocrit: 46.2 % (ref 34.0–46.6)
Hemoglobin: 15.1 g/dL (ref 11.1–15.9)
Immature Grans (Abs): 0 x10E3/uL (ref 0.0–0.1)
Immature Granulocytes: 0 %
Lymphocytes Absolute: 3.3 x10E3/uL — ABNORMAL HIGH (ref 0.7–3.1)
Lymphs: 37 %
MCH: 28.9 pg (ref 26.6–33.0)
MCHC: 32.7 g/dL (ref 31.5–35.7)
MCV: 88 fL (ref 79–97)
Monocytes Absolute: 0.5 x10E3/uL (ref 0.1–0.9)
Monocytes: 5 %
Neutrophils Absolute: 5 x10E3/uL (ref 1.4–7.0)
Neutrophils: 56 %
Platelets: 287 x10E3/uL (ref 150–450)
RBC: 5.23 x10E6/uL (ref 3.77–5.28)
RDW: 13.4 % (ref 11.7–15.4)
WBC: 9.1 x10E3/uL (ref 3.4–10.8)

## 2024-08-06 LAB — COMPREHENSIVE METABOLIC PANEL WITH GFR
ALT: 15 IU/L (ref 0–32)
AST: 17 IU/L (ref 0–40)
Albumin: 4.8 g/dL (ref 3.9–4.9)
Alkaline Phosphatase: 89 IU/L (ref 44–121)
BUN/Creatinine Ratio: 11 (ref 9–23)
BUN: 8 mg/dL (ref 6–20)
Bilirubin Total: 0.3 mg/dL (ref 0.0–1.2)
CO2: 22 mmol/L (ref 20–29)
Calcium: 9.9 mg/dL (ref 8.7–10.2)
Chloride: 102 mmol/L (ref 96–106)
Creatinine, Ser: 0.72 mg/dL (ref 0.57–1.00)
Globulin, Total: 2.4 g/dL (ref 1.5–4.5)
Glucose: 83 mg/dL (ref 70–99)
Potassium: 4.6 mmol/L (ref 3.5–5.2)
Sodium: 140 mmol/L (ref 134–144)
Total Protein: 7.2 g/dL (ref 6.0–8.5)
eGFR: 114 mL/min/1.73 (ref >59)

## 2024-08-06 LAB — LAMOTRIGINE LEVEL: Lamotrigine Lvl: 8.2 ug/mL (ref 2.0–20.0)

## 2024-08-07 ENCOUNTER — Encounter: Admitting: Obstetrics and Gynecology

## 2024-08-11 NOTE — Procedures (Signed)
    History:  33 year old woman with epilepsy   EEG classification: Awake and drowsy  Duration: 26 minutes   Technical aspects: This EEG study was done with scalp electrodes positioned according to the 10-20 International system of electrode placement. Electrical activity was reviewed with band pass filter of 1-70Hz , sensitivity of 7 uV/mm, display speed of 78mm/sec with a 60Hz  notched filter applied as appropriate. EEG data were recorded continuously and digitally stored.   Description of the recording: The background rhythms of this recording consists of a fairly well modulated medium amplitude alpha rhythm of 12 Hz that is reactive to eye opening and closure. Present in the anterior head region is a 15-20 Hz beta activity. Photic stimulation was performed, did not show any abnormalities. Hyperventilation was also performed, did not show any abnormalities. Drowsiness was manifested by background fragmentation. No abnormal epileptiform discharges seen during this recording. There was no focal slowing. There were no electrographic seizure identified.   Abnormality: None   Impression: This is a normal awake and drowsy EEG. No evidence of interictal epileptiform discharges. Normal EEGs, however, do not rule out epilepsy.    [name], MD [facility]

## 2024-08-11 NOTE — Telephone Encounter (Signed)
 Sent orders/notes to [facility] via email/scheduling team.

## 2024-08-12 NOTE — Telephone Encounter (Signed)
 Received email from scheduling confirming they received order.

## 2024-08-20 NOTE — Telephone Encounter (Signed)
 Received email update from [facility] today: Good afternoon, we hope you've been well. We wanted to let you know that Danielle Ramsey  (DOB:[id number]) is scheduled for their At-Home EEG from 9/19-9/22, per request. Please let us  know if you have any questions or comments

## 2024-09-08 DIAGNOSIS — G40909 Epilepsy, unspecified, not intractable, without status epilepticus: Secondary | ICD-10-CM | POA: Diagnosis not present

## 2024-09-11 NOTE — Telephone Encounter (Signed)
 Emailed [facility] and got confirmation that pt completed:  Yes, that is correct. The patient completed testing as of 9/22 and it is currently in the process of being interpreted. Thank you!

## 2024-09-17 ENCOUNTER — Encounter: Admitting: Physician Assistant

## 2024-09-22 ENCOUNTER — Ambulatory Visit: Admitting: Physician Assistant

## 2024-09-22 ENCOUNTER — Encounter: Payer: Self-pay | Admitting: Family Medicine

## 2024-09-22 ENCOUNTER — Encounter: Payer: Self-pay | Admitting: Physician Assistant

## 2024-09-22 ENCOUNTER — Encounter (INDEPENDENT_AMBULATORY_CARE_PROVIDER_SITE_OTHER): Payer: Self-pay | Admitting: Neurology

## 2024-09-22 ENCOUNTER — Other Ambulatory Visit: Payer: Self-pay | Admitting: Neurology

## 2024-09-22 VITALS — BP 118/70 | HR 108 | Temp 98.2°F | Ht 60.0 in | Wt 187.2 lb

## 2024-09-22 DIAGNOSIS — F17210 Nicotine dependence, cigarettes, uncomplicated: Secondary | ICD-10-CM

## 2024-09-22 DIAGNOSIS — F319 Bipolar disorder, unspecified: Secondary | ICD-10-CM

## 2024-09-22 DIAGNOSIS — G40909 Epilepsy, unspecified, not intractable, without status epilepticus: Secondary | ICD-10-CM

## 2024-09-22 DIAGNOSIS — Z0000 Encounter for general adult medical examination without abnormal findings: Secondary | ICD-10-CM

## 2024-09-22 DIAGNOSIS — Z6836 Body mass index (BMI) 36.0-36.9, adult: Secondary | ICD-10-CM

## 2024-09-22 DIAGNOSIS — K219 Gastro-esophageal reflux disease without esophagitis: Secondary | ICD-10-CM

## 2024-09-22 DIAGNOSIS — E88819 Insulin resistance, unspecified: Secondary | ICD-10-CM

## 2024-09-22 DIAGNOSIS — T23171A Burn of first degree of right wrist, initial encounter: Secondary | ICD-10-CM

## 2024-09-22 DIAGNOSIS — E669 Obesity, unspecified: Secondary | ICD-10-CM

## 2024-09-22 MED ORDER — PANTOPRAZOLE SODIUM 40 MG PO TBEC: DELAYED_RELEASE_TABLET | ORAL | 1 refills | Status: DC

## 2024-09-22 NOTE — Progress Notes (Signed)
 Subjective:    Danielle Ramsey is a 33 y.o. female and is here for a comprehensive physical exam.  HPI  There are no preventive care reminders to display for this patient.  Discussed the use of AI scribe software for clinical note transcription with the patient, who gave verbal consent to proceed.  History of Present Illness   Danielle Ramsey is a 33 year old female who presents for a routine check-up.  She experiences seizures typically occurring before waking up. She disagrees with a previous suggestion that she might be hypoglycemic. She is on metformin  for prediabetes and occasionally experiences diarrhea. She takes pantoprazole  daily for heartburn, which has decreased in severity, but she still experiences a sensation of extra air and hiccups. She walks around the house aiming for at least 1500 steps daily and expresses a need for more exercise but has concerns about safety and motivation. She uses marijuana daily.      Health Maintenance: Immunizations -- declined today Colonoscopy -- n/a Mammogram -- n/a PAP -- scheduled for Wednesday  Bone Density -- n/a Diet -- working on healthy diet Exercise -- 1500 steps  Sleep habits -- no major concerns Mood -- overall stable  UTD with dentist? - yes UTD with eye doctor? - yes  Weight history: Wt Readings from Last 10 Encounters:  09/22/24 187 lb 4 oz (84.9 kg)  08/05/24 184 lb (83.5 kg)  06/17/24 185 lb 8 oz (84.1 kg)  02/13/24 188 lb 8 oz (85.5 kg)  01/01/24 183 lb 3.2 oz (83.1 kg)  12/27/23 185 lb 8 oz (84.1 kg)  05/31/23 178 lb (80.7 kg)  05/25/23 177 lb 6.1 oz (80.5 kg)  05/15/23 177 lb (80.3 kg)  12/26/22 174 lb (78.9 kg)   Body mass index is 36.57 kg/m. Patient's last menstrual period was 08/27/2024 (approximate).  Alcohol  use:  reports no history of alcohol  use.  Tobacco use:  Tobacco Use: High Risk (09/22/2024)   Patient History    Smoking Tobacco Use: Every Day    Smokeless Tobacco Use: Never    Passive  Exposure: Not on file   Eligible for lung cancer screening? no     01/01/2024    2:15 PM  Depression screen PHQ 2/9  Decreased Interest 0  Down, Depressed, Hopeless 1  PHQ - 2 Score 1  Altered sleeping 0  Tired, decreased energy 0  Change in appetite 0  Feeling bad or failure about yourself  0  Trouble concentrating 1  Moving slowly or fidgety/restless 0  Suicidal thoughts 0  PHQ-9 Score 2     Other providers/specialists: Patient Care Team: Job Lukes, [place] as PCP - General (Physician Assistant) Center, [facility] as Consulting Physician (Behavioral Health) Glena Collard, MD as Referring Physician (Obstetrics and Gynecology)    PMHx, SurgHx, SocialHx, Medications, and Allergies were reviewed in the Visit Navigator and updated as appropriate.   Past Medical History:  Diagnosis Date   Asthma    Depression    Ectopic pregnancy    Hyperlipidemia    Seizures (HCC)      Past Surgical History:  Procedure Laterality Date   APPENDECTOMY       Family History  Problem Relation Age of Onset   Diabetes Mother    Hypertension Mother    Hyperlipidemia Mother    Early death Father 26   Stroke Paternal Grandmother    Colon cancer Neg Hx    Breast cancer Neg Hx     Social History  Tobacco Use   Smoking status: Every Day    Current packs/day: 1.00    Average packs/day: 1 pack/day for 14.8 years (14.8 ttl pk-yrs)    Types: Cigarettes    Start date: 2011   Smokeless tobacco: Never  Vaping Use   Vaping status: Never Used  Substance Use Topics   Alcohol  use: No   Drug use: Yes    Types: Marijuana    Comment: and then smoked it agian and having seizure    Review of Systems:   Review of Systems  Constitutional:  Negative for chills, fever, malaise/fatigue and weight loss.  HENT:  Negative for hearing loss, sinus pain and sore throat.   Respiratory:  Negative for cough and hemoptysis.   Cardiovascular:  Negative for chest pain,  palpitations, leg swelling and PND.  Gastrointestinal:  Negative for abdominal pain, constipation, diarrhea, heartburn, nausea and vomiting.  Genitourinary:  Negative for dysuria, frequency and urgency.  Musculoskeletal:  Negative for back pain, myalgias and neck pain.  Skin:  Negative for itching and rash.  Neurological:  Negative for dizziness, tingling, seizures and headaches.  Endo/Heme/Allergies:  Negative for polydipsia.  Psychiatric/Behavioral:  Negative for depression. The patient is not nervous/anxious.     Objective:   BP 118/70 (BP Location: Left Arm, Patient Position: Sitting, Cuff Size: Large)   Pulse (!) 108   Temp 98.2 F (36.8 C) (Oral)   Ht 5' (1.524 m)   Wt 187 lb 4 oz (84.9 kg)   LMP 08/27/2024 (Approximate)   SpO2 98%   BMI 36.57 kg/m  Body mass index is 36.57 kg/m.   General Appearance:    Alert, cooperative, no distress, appears stated age  Head:    Normocephalic, without obvious abnormality, atraumatic  Eyes:    PERRL, conjunctiva/corneas clear, EOM's intact, fundi    benign, both eyes  Ears:    Normal TM's and external ear canals, both ears  Nose:   Nares normal, septum midline, mucosa normal, no drainage    or sinus tenderness  Throat:   Lips, mucosa, and tongue normal; teeth and gums normal  Neck:   Supple, symmetrical, trachea midline, no adenopathy;    thyroid :  no enlargement/tenderness/nodules; no carotid   bruit or JVD  Back:     Symmetric, no curvature, ROM normal, no CVA tenderness  Lungs:     Clear to auscultation bilaterally, respirations unlabored  Chest Wall:    No tenderness or deformity   Heart:    Regular rate and rhythm, S1 and S2 normal, no murmur, rub or gallop  Breast Exam:    Deferred  Abdomen:     Soft, non-tender, bowel sounds active all four quadrants,    no masses, no organomegaly  Genitalia:    Deferred   Extremities:   Extremities normal, atraumatic, no cyanosis or edema  Pulses:   2+ and symmetric all extremities  Skin:    Skin color, texture, turgor normal 3 small well-healing areas of superficial skin loss to right anterior wrist  Lymph nodes:   Cervical, supraclavicular, and axillary nodes normal  Neurologic:   CNII-XII intact, normal strength, sensation and reflexes    throughout    Assessment/Plan:   Assessment and Plan    Adult wellness visit Discussed healthy eating, exercise, and regular screenings. - Encourage walking for exercise. - Promote healthy eating habits with balanced meals.   Seizure disorder (HCC) Seizures occur in the morning. Hypoglycemic seizures dismissed. Metformin  for prediabetes may contribute to hypoglycemia.  Prediabetes  Managed with metformin . Occasional diarrhea noted. Discussed blood glucose management and impact on seizures.  Gastroesophageal reflux disease (GERD) Persistent symptoms despite pantoprazole . Discussed medication timing and dietary influences. - Increase pantoprazole  to twice daily for one month. - Reassess after one month and reduce dose if symptoms improve.  Burn of right arm Burn from hot oil splash. No infection signs. - Apply silver sulfadiazine cream with thick layer and cover with bandage daily until healed. - Provide silver sulfadiazine cream for home use.  Bipolar 1 disorder (HCC) Continues on Lamictal  for seizure and bipolar disorder Overall feels stable  Insulin resistance Most recent blood work reviewed  Obesity, unspecified class, unspecified obesity type, unspecified whether serious comorbidity present Continue efforts -- encouraged more consistent exercise  Cigarette nicotine dependence without complication She is not motivated to decrease this at this time  Superficial burn of right wrist, initial encounter No red flags Will treat with topical silver cream     Keyden Pavlov, PA-C [facility]

## 2024-09-22 NOTE — Patient Instructions (Signed)
 It was great to see you!  Apply cream to burn daily until well healed  Let's follow-up in 1 year, sooner if you have concerns.  Take care,  Lucie Buttner PA-C

## 2024-09-22 NOTE — Procedures (Signed)
 Patient Name: Kyndahl Jablon  MRN: [id number]  Referring Physician/Provider:       Study start date: 09/05/2024 at 325 PM Study end date: 09/08/2024 at 340 PM Duration: 72 hours    Clinical History:   33 Y/O FEMALE WHO IS HERE FOR SEIZURES WHO HAD A BREAKTHROUGH SEIZURE IN 09/2023 PATIENTS LAST SEIZURE WAS 07/05/2024. WAS WITNESS BUT OTHERS WITH RIGHT LEG JERKING LASTING 1.5 MINUTES WAKING UP IN A COLD SWEAT. PATIENT WAS GIVEN WATER THEN VOMITTED. PATIENTS HAS HAD A TOTAL OF 3 SEIZURES.   INTERMITTENT MONITORING with VIDEO TECHNICAL SUMMARY:  This AVEEG was performed using equipment provided by [employer] utilizing Bluetooth ( Track[employer] ) amplifiers with continuous EEGT attended video collection using encrypted remote transmission via [employer] secured cellular tower network with data rates for each AVEEG performed. This is a Therapist, music AVEEG, obtained, according to the 10-20 international electrode placement system, reformatted digitally into referential and bipolar montages. Data was acquired with a minimum of 21 bipolar connections and sampled at a minimum rate of 250 cycles per second per channel, maximum rate of 450 cycles per second per channel and two channels for EKG. The entire VEEG study was recorded through cable and or radio telemetry for subsequent analysis. Specified epochs of the AVEEG data were identified at the direction of the subject by the depression of a push button by the patient. Each patients event file included data acquired two minutes prior to the push button activation and continuing until two minutes afterwards. AVEEG files were reviewed on [employer] server, Licensed Software provided by [employer] with a digital high frequency filter set at 70 Hz and a low frequency filter set at 1 Hz with a paper speed of 39mm/s resulting in 10 seconds per digital page. This entire AVEEG was reviewed by the EEG Technologist. Random time samples, random sleep  samples, clips, patient initiated push button files with included patient daily diary logs, EEG Technologist pruned data was reviewed and verified for accuracy and validity by the governing reading neurologist in full details. This AEEGV was fully compliant with all requirements for CPT 97500 for setup, patient education, take down and administered by an EEG technologist.   Long-Term EEG with Video was monitored intermittently by a qualified EEG technologist for the entirety of the recording; quality check-ins were performed at a minimum of every two hours, checking and documenting real-time data and video to assure the integrity and quality of the recording (e.g., camera position, electrode integrity and impedance), and identify the need for maintenance. For intermittent monitoring, an EEG Technologist monitored no more than 12 patients concurrently. Diagnostic video was captured at least 80% of the time during the recording.   PATIENT EVENTS:  There were no patient events noted or captured during this recording.   TECHNOLOGIST EVENTS:  No clear epileptiform activity was detected by the reviewing neurodiagnostic technologist for further review.   TIME SAMPLES:  10-minutes of every 2 hours recorded are reviewed as random time samples.   SLEEP SAMPLES:  5-minutes of every 24 hours recorded are reviewed as random sleep samples.   AWAKE:  At maximal level of alertness, the posterior dominant background activity was continuous, reactive, low voltage rhythm of 10 Hz. This was symmetric, well-modulated, and attenuated with eye opening. Diffuse, symmetric, frontocentral beta range activity was present.  SLEEP:  N1 Sleep (Stage 1) was observed and characterized by the disappearance of alpha rhythm and the appearance of vertex activity.   N2 Sleep (Stage  2) was observed and characterized by vertex waves, K-complexes, and sleep spindles.   N3 (Stage 3) sleep was observed and characterized by high  amplitude Delta activity of 20%.   REM sleep was observed.   EKG:  There were no arrhythmias or abnormalities noted during this recording.   Impression:  Normal EEG: awake and asleep   Clinical Correlation:  This is a normal 3-day ambulatory EEG tracing. No focal abnormalities or epileptiform discharges were seen. There were no electrographic seizures noted. No events were captured during the recording. Please note a normal EEG does not exclude the diagnosis of epilepsy.   Deondrick Searls, MD [facility]

## 2024-09-22 NOTE — Telephone Encounter (Signed)
 EEG completed and it was normal.

## 2024-09-24 ENCOUNTER — Ambulatory Visit: Admitting: Obstetrics and Gynecology

## 2024-10-16 ENCOUNTER — Encounter: Payer: Self-pay | Admitting: Obstetrics and Gynecology

## 2024-10-16 ENCOUNTER — Ambulatory Visit (INDEPENDENT_AMBULATORY_CARE_PROVIDER_SITE_OTHER): Admitting: Obstetrics and Gynecology

## 2024-10-16 VITALS — BP 124/88 | HR 83 | Ht 59.06 in | Wt 184.6 lb

## 2024-10-16 DIAGNOSIS — Z23 Encounter for immunization: Secondary | ICD-10-CM | POA: Diagnosis not present

## 2024-10-16 DIAGNOSIS — Z3169 Encounter for other general counseling and advice on procreation: Secondary | ICD-10-CM | POA: Diagnosis not present

## 2024-10-16 NOTE — Progress Notes (Signed)
 Acute Office Visit  Subjective:    Patient ID: Danielle Ramsey, female    DOB: Jul 04, 1991, 33 y.o.   MRN: [id number]   HPI 33 y.o. presents today for Consult (Referral from PCP for fertility concerns and needs a gyn provider) .  Patient's last menstrual period was 09/24/2024 (exact date). Period Cycle (Days): 29 Period Duration (Days): 4-6 Period Pattern: Regular Menstrual Flow: Moderate Menstrual Control: Thin pad Dysmenorrhea: (!) Mild Dysmenorrhea Symptoms: Cramping, Other (Comment) (lower back pain and cravings)  Review of Systems    Periods q 28 days and are not painful or heavy. Denies any h/o STI Denies any abnormal pap smears She reports trying to have a HSG done in [place] and she felt like her uterus was on fire and could not complete the study due to the pain. H/o ectopic and had methotrexate and has not become pregnant since then. She is with a new partner for 2 years and has not had a HSG or semen analysis He has fathered two grown children They are both smokers. Denies any STI  Objective:    OBGyn Exam  BP 124/88   Pulse 83   Ht 4' 11.06 (1.5 m)   Wt 184 lb 9.6 oz (83.7 kg)   LMP 09/24/2024 (Exact Date)   SpO2 97%   BMI 37.22 kg/m  Wt Readings from Last 3 Encounters:  10/16/24 184 lb 9.6 oz (83.7 kg)  09/22/24 187 lb 4 oz (84.9 kg)  08/05/24 184 lb (83.5 kg)   No results found for: EDMON RUSH, ADEQPAP     Date: 07/22/2019 Department: [facility] Released By: Trudy Roselie RAMAN Authorizing: Prentiss Frieze, DO   Exam Status  Status  Final [99]   PACS Intelerad Image Link   Show images for US  Pelvis Complete Study Result  Narrative & Impression  CLINICAL DATA:  Pain, irregular menses, history of ectopic pregnancy   EXAM: TRANSABDOMINAL ULTRASOUND OF PELVIS   TECHNIQUE: Transabdominal ultrasound examination of the pelvis was performed including evaluation of the uterus, ovaries, adnexal  regions, and pelvic cul-de-sac.   COMPARISON:  CT abdomen and pelvis 01/08/2014   FINDINGS: Uterus   Measurements: 8.0 x 2.1 x 3.7 cm = volume: 33 mL. Anteverted. Normal morphology without mass   Endometrium   Thickness: 6 mm.  No endometrial fluid or focal abnormality   Right ovary   Measurements: 2.7 x 1.8 x 1.6 cm = volume: 4.1 mL. Normal morphology without mass   Left ovary   Measurements: 2.5 x 1.4 x 1.4 cm = volume: 2.6 mL. Normal morphology without mass   Other findings: No free pelvic fluid. No adnexal masses. Visualized bladder unremarkable.   IMPRESSION: Normal exam.     Electronically Signed   By: Oneil Kiss M.D.   Past Medical History:  Diagnosis Date   Asthma    Depression    Ectopic pregnancy    Hyperlipidemia    Seizures (HCC)     Past Surgical History:  Procedure Laterality Date   APPENDECTOMY     Social History   Socioeconomic History   Marital status: Single    Spouse name: Not on file   Number of children: Not on file   Years of education: Not on file   Highest education level: Some college, no degree  Occupational History   Not on file  Tobacco Use   Smoking status: Every Day    Current packs/day: 1.00    Average packs/day: 1 pack/day for  14.8 years (14.8 ttl pk-yrs)    Types: Cigarettes    Start date: 2011   Smokeless tobacco: Never  Vaping Use   Vaping status: Never Used  Substance and Sexual Activity   Alcohol  use: No   Drug use: Yes    Types: Marijuana    Comment: and then smoked it agian and having seizure   Sexual activity: Yes    Partners: Male    Birth control/protection: None    Comment: incourse at 33yrs old, 1st period at 63 or 33 yrs old  Other Topics Concern   Not on file  Social History Narrative   Currently on disability for bipolar type I disorder   Was hospitalized at age [age]   Works at programme researcher, broadcasting/film/video as a production designer, theatre/television/film   Social Drivers of Corporate Investment Banker Strain: Low Risk  (01/01/2024)    Overall Financial Resource Strain (CARDIA)    Difficulty of Paying Living Expenses: Not hard at all  Food Insecurity: No Food Insecurity (01/01/2024)   Hunger Vital Sign    Worried About Running Out of Food in the Last Year: Never true    Ran Out of Food in the Last Year: Never true  Transportation Needs: No Transportation Needs (01/01/2024)   PRAPARE - Administrator, Civil Service (Medical): No    Lack of Transportation (Non-Medical): No  Physical Activity: Insufficiently Active (01/01/2024)   Exercise Vital Sign    Days of Exercise per Week: 4 days    Minutes of Exercise per Session: 30 min  Stress: Stress Concern Present (01/01/2024)   [employer] of Occupational Health - Occupational Stress Questionnaire    Feeling of Stress : To some extent  Social Connections: Socially Isolated (01/01/2024)   Social Connection and Isolation Panel    Frequency of Communication with Friends and Family: More than three times a week    Frequency of Social Gatherings with Friends and Family: Three times a week    Attends Religious Services: Never    Active Member of Clubs or Organizations: No    Attends Banker Meetings: Never    Marital Status: Never married  Intimate Partner Violence: Not At Risk (01/01/2024)   Humiliation, Afraid, Rape, and Kick questionnaire    Fear of Current or Ex-Partner: No    Emotionally Abused: No    Physically Abused: No    Sexually Abused: No   OB History     Gravida  2   Para      Term      Preterm      AB  2   Living         SAB  1   IAB      Ectopic  1   Multiple      Live Births             Current Outpatient Medications on File Prior to Visit  Medication Sig Dispense Refill   albuterol  (PROAIR  HFA) 108 (90 Base) MCG/ACT inhaler Inhale 2 puffs into the lungs every 6 (six) hours as needed for wheezing or shortness of breath. 18 g 2   clindamycin  (CLINDAGEL) 1 % gel Apply topically 2 (two) times daily. 30 g 0    lamoTRIgine  (LAMICTAL ) 200 MG tablet Take 1 tablet (200 mg total) by mouth 2 (two) times daily. 180 tablet 3   metFORMIN  (GLUCOPHAGE -XR) 500 MG 24 hr tablet Take 1 tablet (500 mg total) by mouth daily with breakfast. 90 tablet  1   montelukast  (SINGULAIR ) 10 MG tablet Take 1 tablet (10 mg total) by mouth daily. 90 tablet 1   pantoprazole  (PROTONIX ) 40 MG tablet Take 2 tablets daily x 4 weeks, then reduce to 1 tablet daily 60 tablet 1   No current facility-administered medications on file prior to visit.    Assessment & Plan:  Referral to [facility] for HSG and semen analysis Needs annual exam and labs Counseled on the gardesil vaccination. Patient would like to begin the vaccination Counseled on importance of timed intercourse Counseled on smoking cessation and decreased fertility with smoking.  45 minutes spent on reviewing records, imaging,  and one on one patient time and counseling patient and documentation Dr. Glennon Almarie MARLA Glennon

## 2024-12-11 ENCOUNTER — Other Ambulatory Visit: Payer: Self-pay | Admitting: Physician Assistant

## 2024-12-11 ENCOUNTER — Encounter: Payer: Self-pay | Admitting: Physician Assistant

## 2024-12-12 ENCOUNTER — Other Ambulatory Visit: Payer: Self-pay | Admitting: *Deleted

## 2024-12-12 MED ORDER — PANTOPRAZOLE SODIUM 40 MG PO TBEC: 40.0000 mg | DELAYED_RELEASE_TABLET | Freq: Every day | ORAL | 0 refills | Status: AC

## 2024-12-12 NOTE — Telephone Encounter (Signed)
 Correction  Rx pantoprazole  send to pharmacy

## 2024-12-12 NOTE — Telephone Encounter (Signed)
 See note Rx Omeprazole send to pharmacy

## 2024-12-17 ENCOUNTER — Ambulatory Visit

## 2025-01-06 ENCOUNTER — Ambulatory Visit: Payer: 59

## 2025-01-28 ENCOUNTER — Ambulatory Visit

## 2025-03-25 ENCOUNTER — Ambulatory Visit: Admitting: Neurology

## 2025-09-23 ENCOUNTER — Encounter: Admitting: Physician Assistant
